# Patient Record
Sex: Female | Born: 1995 | Race: Black or African American | Hispanic: No | Marital: Single | Smoking: Never smoker
Health system: Southern US, Community
[De-identification: ages and names within clinical notes are randomized; demographics above are authoritative.]

## PROBLEM LIST (undated history)

## (undated) ENCOUNTER — Inpatient Hospital Stay: Payer: Self-pay

## (undated) DIAGNOSIS — T7840XA Allergy, unspecified, initial encounter: Secondary | ICD-10-CM

## (undated) DIAGNOSIS — O364XX0 Maternal care for intrauterine death, not applicable or unspecified: Secondary | ICD-10-CM

## (undated) DIAGNOSIS — Z8759 Personal history of other complications of pregnancy, childbirth and the puerperium: Secondary | ICD-10-CM

## (undated) HISTORY — DX: Allergy, unspecified, initial encounter: T78.40XA

## (undated) HISTORY — DX: Maternal care for intrauterine death, not applicable or unspecified: O36.4XX0

## (undated) HISTORY — DX: Personal history of other complications of pregnancy, childbirth and the puerperium: Z87.59

## (undated) HISTORY — PX: WISDOM TOOTH EXTRACTION: SHX21

---
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
## 2014-06-17 ENCOUNTER — Emergency Department: Payer: Self-pay | Admitting: Emergency Medicine

## 2014-06-19 ENCOUNTER — Emergency Department: Payer: Self-pay | Admitting: Emergency Medicine

## 2014-06-26 LAB — TSH: TSH: 0.32 u[IU]/mL — AB (ref <=5.90)

## 2014-06-26 LAB — LIPID PANEL
Cholesterol: 146 mg/dL (ref 0–200)
HDL: 44 mg/dL (ref 35–70)
LDL Cholesterol: 87 mg/dL
Triglycerides: 75 mg/dL (ref 40–160)

## 2014-06-26 LAB — BASIC METABOLIC PANEL: Glucose: 74 mg/dL

## 2014-06-29 ENCOUNTER — Encounter: Payer: Self-pay | Admitting: *Deleted

## 2014-07-03 ENCOUNTER — Encounter: Payer: Self-pay | Admitting: *Deleted

## 2014-07-05 ENCOUNTER — Encounter: Payer: Self-pay | Admitting: Endocrinology

## 2014-07-05 ENCOUNTER — Encounter: Payer: Medicaid Other | Admitting: Endocrinology

## 2014-07-05 DIAGNOSIS — R7989 Other specified abnormal findings of blood chemistry: Secondary | ICD-10-CM | POA: Insufficient documentation

## 2014-07-05 DIAGNOSIS — N6452 Nipple discharge: Secondary | ICD-10-CM | POA: Insufficient documentation

## 2014-07-05 NOTE — Progress Notes (Signed)
Pre visit review using our clinic review tool, if applicable. No additional management support is needed unless otherwise documented below in the visit note. 

## 2014-07-05 NOTE — Progress Notes (Signed)
Reason for visit: Low TSH/hyperthyroidism HPI  Holly Holland is a 18 y.o.-year-old female, referred by her PCP,  Tommy RainwaterSOWLES,KRICKNA F, MD, for evaluation for hyperthyroidism/low TSH.   The patient was checked in for this visit per usual. We then realized that our office doesn't accept [facility] and the patient was wrongly scheduled. This was explained to the patient.  She and I elected not to continue this visit. I apologized to the patient for the error created.  Our office called the referral office to divert the referral to an endocrine office that does accept her insurance.  The patient will not be billed for this encounter.   Quillian QuinceHADKE,Eddie Koc [facility] 07/05/2014  2:48 PM This encounter was created in error - please disregard.

## 2014-09-07 NOTE — L&D Delivery Note (Addendum)
DELIVERY NOTE:  Date of Delivery: 07/01/2015 Primary OB: [facility]  Gestational Age/EDD: 2641w0d 06/24/2015, by Last Menstrual Period Antepartum complications: none Attending Physician: Annamarie MajorPaul Darren Caldron, MD, FACOG Delivery Type: primary cesarean section, low transverse incision  Anesthesia: epidural Laceration: n/a Episiotomy: none Placenta: manual removal Intrapartum complications: Failure to Progress Estimated Blood Loss: 500 GBS: pos Procedure Details: CPD/Arrest of descent at 9 cm.  CS.    Baby: Liveborn female, Apgars 9/9, weight 7 #, 4 oz,

## 2014-10-16 ENCOUNTER — Emergency Department: Payer: Self-pay | Admitting: Emergency Medicine

## 2014-12-28 NOTE — Consult Note (Signed)
Referral Information:  Reason for Referral Fetal hydrops   Referring Physician WS   Prenatal Hx Holly Holland is an 19 year-old G1 P0 at 20 4/7 weeks ([place] 10/15/13) who presents with her mother, the father-of-the-baby and his mother for consultation regarding ultrasound findings from today that demonstrated fetal hydrops.  She had an ultrasound at 9 6/7 weeks at [facility] demonstrating a possible cystic hygroma. She returned at 13 3/7 weeks and Harmony testing sent (without sex chromosomes) that demonstrated low risk (less than 1 in 10,000) for each Trisomy 21, 13, or 18.    She has no medical problems. She denies any viral/flu-like illnesses this pregnancy. She also denies vaginal bleeding.   Past Obstetrical Hx G1 P0   Home Medications: Medication Instructions Status  multivitamin, prenatal 1  orally once a day Active   Vital Signs/Notes:  Nursing Vital Signs: **Vital Signs.:   11-Sep-14 08:48  Pulse Pulse 161  Systolic BP Systolic BP 096  Diastolic BP (mmHg) Diastolic BP (mmHg) 83   Perinatal Consult:  PGyn Hx Denies history of abnormal paps or STDs   Past Medical History cont'd Denies history of HTN, DM or endocrine disorders   PSurg Hx Wisdom teeth extraction, no complications   FHx Denies FH of birth defects, mental retardation or genetic disorders   Soc Hx Single. Denies use of ETOH, tobacco or drugs. Father of the baby is involved.   Review Of Systems:  Subjective No complaints. No rashes. No fever. No abdominal pain. No vagainal bleeding   Fever/Chills No   Cough No   Abdominal Pain No   Nausea/Vomiting No   SOB/DOE No   Chest Pain No   Tolerating Diet Yes   Medications/Allergies Reviewed Reports no known drug allergies    Additional Lab/Radiology Notes [place] ([place], 05/18/13): See full [place] report. IUP at 18 4/7 weeks. Hydrops (cystic hygroma, anasarca, large bilateral pleural effusions). Heart suboptimally visualized but concern for  hypoplastic ventricle.  The heart rate was normal and regular.  Right kidney also not visualized.  Amniocentesis performed. Fluid sent for AFP, karyotype, and toxo/CMV pcr.  Prenatal labs (02/10/13, [facility]): Blood type O positive, antibody screen negative, RPR non-reactive, HIV non-reactive, Hep B negative, Hct 33.5, MCV 94, Plt 268, GC/Chl negative, Varicella immune, Rubella immune  Harmony (04/12/13, [facility]): Low risk for trisomy 21, 13, or 18 (less than 1 in 10,000).  Sex chromosomes not performed. We called to add on but sample no longer available per [facility].   Impression/Recommendations:  Impression 19 year-old G1 P0 at 34 4/7 weeks with pregnancy complicated by fetal hydrops (large cystic hygroma, bilateral pleural effusions and anasara.  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   Recommendations Ms Amara and her family met with me and with Ms. Wells (Genetic Counseling) concerning the findings of non-immune hydrops and other fetal findings as listed above.  We disscussed the large differential diagnosis for non-immune hydrops including but not limited to aneuploidy, syndromes, congential heart disease and other congenital anomolies and congital infections. We also discussed that the outcomes are dependent on the etiology for hydrops but in general there is a very poor prognosis for hydrops.  Her Harmony testing was normal but only looked at Trisomy 18, 13, and 21. She elected to proceed with amniocentesis, which was sent for karyotype, AFP and Toxo/CMV pcr.  Visualization of the heart anatomy was very limited by fetal position and  the presence of anasarca and bilateral pleural effusions. We could not obtain a normal-appearing four chamber view and have concerns for possible hypoplastic ventricle.  Furthermore, we were unable to obtain adequate views of the outflow tracts or  short-axis f We have arranged for a fetal echo to be done at [facility] tomorrow.  She is aware of the poor prognosis for this fetus. She declines pregnancy termination and desires to proceed with a workup. We also discussed risk for early, severe preeclampsia (mirror syndrome) and risk for fetal demise.  Plan: -Amnio sent for AFP, karyotype and toxo/cmv pcr -Fetal echo scheduled for tomorrow at [facility] pregnancy termination -Return in one week to review results and for f/u ultrasound -Follow closely for signs of preeclampsia.  -May need transfer of care for delivery at a tertiary care facility depending on workup. -Please call with any questions.  -See genetic counseling report -[place] scheduled in one week for viability, kidneys, hydrops    Total Time Spent with Patient 60 minutes   >50% of visit spent in couseling/coordination of care yes   Office Use Only 99244  Level 4 (20mn) NEW office consult low complexity   Coding Description: FETAL - 2nd/3rd TRIMESTER INDICATION(S).   Hydrops.  Electronic Signatures: Conard Alvira, CMali(MD)  (Signed 11-Sep-14 15:14)  Authored: Referral, Home Medications, Vital Signs/Notes, Consult, Exam, Lab/Radiology Notes, Impression, Billing, Coding Description   Last Updated: 11-Sep-14 15:14 by Sharlotte Baka, CMali(MD)

## 2014-12-28 NOTE — Consult Note (Signed)
Referral Information:  Reason for Referral Followup MFM consult   Referring Physician [facility]   Prenatal Hx Holly Holland returns today for viability ultrasound and to review the results of her fetal echo.  See full MFM consult dated 05/18/13.  In short, Holly Holland is an 19 year-old G1 P0 at 3519 4/7 weeks with pregnancy complicated by hydrops.  There is a large septated cystic hygroma, large bilateral pleural effusions and anasarca.   Our detailed ultrasound last week showed concern for a hypoplastic ventricle and we could only image one AV valve.  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  In addition, we have not been able to visualize a right kidney.  We performed an amniocentesis last week.  Toxo and CMV infection studies (pcr) were negative.  The amniotic fluid AFP level was normal. The karyotype is pending.  Holly Holland understands the very severe situation.   Allergies:   Pollen: Other   Additional Lab/Radiology Notes US [place]([facility], 05/25/13): Followup viability scan was performed today.  There is continued hydrops present (large septated cystic hygroma, large bilateral pleural effusions, anasarca).  The bladder is not visualized today. The right kidney is again not visualized. The amniotic fluid volume is normal and there is no placenta thickening. The left ventricle is hypoplastic. See full US report from today and 05/18/13.   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   Recommendations Holly Holland declines pregnancy termination. She and her family understand the extreme poor prognosis for this baby. She also understands the risk for stillbirth, maternal  mirror syndrome, and neonatal death.  We have arranged to [facility]. Her appointment is scheduled for October 2 at 1:00 pm.  We will schedule a followup ultrasound appointment in the [facility] to be done in about 1 month during that transfer visit.    We discussed signs and symptoms of mirror syndrome. She will call with any concerns.  I have contacted our perinatal nurse coordinator to alert her of this case as well. Finally, Holly Holland has a followup fetal echo scheduled in October.  [facility] and will forward the amniocentesis results when they are available.    Total Time Spent with Patient 15 minutes   >50% of visit spent in couseling/coordination of care yes   Office Use Only [id number]  Office Visit Level 3 (15min) EST exp prob focused outpt   Coding Description: OTHER: Fetal hydrops, Fetal congenital heart disease.  Electronic Signatures: Holly Holland, [place] (MD)  (Signed 18-Sep-14 13:29)  Authored: Referral, Allergies, Lab/Radiology Notes, Impression, Billing, Coding Description   Last Updated: 18-Sep-14 13:29 by Naod Sweetland, [place] (MD)

## 2015-03-10 ENCOUNTER — Observation Stay
Admission: EM | Admit: 2015-03-10 | Discharge: 2015-03-10 | Disposition: A | Discharge status: Home / Self Care | Payer: Medicaid Other | Attending: Obstetrics and Gynecology | Admitting: Obstetrics and Gynecology

## 2015-03-10 DIAGNOSIS — O26892 Other specified pregnancy related conditions, second trimester: Secondary | ICD-10-CM | POA: Diagnosis not present

## 2015-03-10 DIAGNOSIS — Z3A24 24 weeks gestation of pregnancy: Secondary | ICD-10-CM | POA: Diagnosis not present

## 2015-03-10 DIAGNOSIS — R109 Unspecified abdominal pain: Secondary | ICD-10-CM | POA: Insufficient documentation

## 2015-03-10 DIAGNOSIS — M549 Dorsalgia, unspecified: Secondary | ICD-10-CM | POA: Insufficient documentation

## 2015-03-10 DIAGNOSIS — Z3490 Encounter for supervision of normal pregnancy, unspecified, unspecified trimester: Secondary | ICD-10-CM

## 2015-03-10 LAB — URINALYSIS COMPLETE WITH MICROSCOPIC (ARMC ONLY)
Bilirubin Urine: NEGATIVE
Glucose, UA: NEGATIVE mg/dL
Hgb urine dipstick: NEGATIVE
Ketones, ur: NEGATIVE mg/dL
Nitrite: NEGATIVE
Protein, ur: NEGATIVE mg/dL
SPECIFIC GRAVITY, URINE: 1.005 (ref 1.005–1.030)
pH: 7 (ref 5.0–8.0)

## 2015-03-10 NOTE — Progress Notes (Signed)
Pt given d/c inst and verbalized understanding.  Pt was then d/c home with mother and SO in stable condition.

## 2015-03-10 NOTE — OB Triage Provider Note (Signed)
Triage Note  Holly Holland is a 19 y.o. G2P0010 with Estimated Date of Delivery: 06/24/15 per LMP and 8 wk US who presents at 3298w6d  presenting for abdominal cramping and back pain. Reports symptoms last night with resolution and return this evening. Patient states she denies vaginal bleeding, intact membranes, with active fetal movement.  Pain is worse with fetal movement  Prenatal Course Source of Care: [facility] with onset of care at 8 weeks Pregnancy complications or risks: Patient Active Problem List   Diagnosis Date Noted  . Pregnancy 03/10/2015      . Nipple discharge in female 07/05/2014     Prenatal labs and studies: Genetic screening: Panorama negative    Prenatal Transfer Tool   Past Medical History  Diagnosis Date  . Allergy     Past Surgical History  Procedure Laterality Date  . Wisdom tooth extraction      OB History  Gravida Para Term Preterm AB SAB TAB Ectopic Multiple Living  2 0 0 0 1 1 0 0 0 0     # Outcome Date GA Lbr Len/2nd Weight Sex Delivery Anes PTL Lv  2 Current           1 SAB             Obstetric Comments  IUFD for Turner's Syndrome    History   Social History  . Marital Status: Single    Spouse Name: N/A  . Number of Children: N/A  . Years of Education: N/A   Social History Main Topics  . Smoking status: Never Smoker   . Smokeless tobacco: Not on file  . Alcohol Use: No  . Drug Use: No  . Sexual Activity: Yes   Other Topics Concern  . None   Social History Narrative    Family History  Problem Relation Age of Onset  . Asthma Maternal Aunt   . Hypertension Paternal Grandmother     Prescriptions prior to admission  Medication Sig Dispense Refill Last Dose  . Prenatal Vit-Fe Fumarate-FA (PRENATAL MULTIVITAMIN) TABS tablet Take 1 tablet by mouth daily at 12 noon.       No Known Allergies  Review of Systems: Negative except for what is mentioned in HPI.  Physical Exam: BP 119/68 mmHg  Pulse 72  Temp(Src) 98.2 F  (36.8 C) (Oral)  Resp 18  Ht 5\' 4"  (1.626 m)  Wt 170 lb (77.111 kg)  BMI 29.17 kg/m2  LMP 09/17/2014 GENERAL: Well-developed, well-nourished female in no acute distress.  ABDOMEN: Soft, nontender, nondistended, gravid. EXTREMITIES: Nontender, no edema Cervical Exam: Dilatation 0 cm   Effacement 0 %   Station high   FHT: category 1 for gestational age Contractions: none per toco  UA pending   Pertinent Labs/Studies:   No results found for this or any previous visit (from the past 24 hour(s)).  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

## 2015-03-10 NOTE — OB Triage Note (Signed)
Cramping started around 10pm yesterday till 1am.  It went away and came back around 1900 tonight,   Denies ROM or VB.   Abd feels like it is "tightening". Concerned d/t previous loss.

## 2015-03-13 LAB — URINE CULTURE: SPECIAL REQUESTS: NORMAL

## 2015-05-08 ENCOUNTER — Observation Stay
Admission: EM | Admit: 2015-05-08 | Discharge: 2015-05-08 | Disposition: A | Discharge status: Home / Self Care | Payer: Medicaid Other | Attending: Obstetrics and Gynecology | Admitting: Obstetrics and Gynecology

## 2015-05-08 DIAGNOSIS — Z3A33 33 weeks gestation of pregnancy: Secondary | ICD-10-CM | POA: Diagnosis not present

## 2015-05-08 DIAGNOSIS — O26893 Other specified pregnancy related conditions, third trimester: Principal | ICD-10-CM | POA: Insufficient documentation

## 2015-05-08 DIAGNOSIS — O4292 Full-term premature rupture of membranes, unspecified as to length of time between rupture and onset of labor: Secondary | ICD-10-CM | POA: Diagnosis present

## 2015-05-08 DIAGNOSIS — N898 Other specified noninflammatory disorders of vagina: Secondary | ICD-10-CM | POA: Insufficient documentation

## 2015-05-08 NOTE — H&P (Signed)
Obstetric H&P   Chief Complaint: Leaking fluid  Prenatal Care Provider: [facility]  History of Present Illness: 19 y.o. G2P0010 [redacted]w[redacted]d by 06/24/2015, presenting to L&D with complaints of leaking fluid.  Intermitten contractions, no LOF, no VB.  The patients past medical history if notable for prior pregnancy complicated by IUFD, confirmed turners, with negative panorama and 1st trimester screening this pregnancy.  ABO, Rh: O pos  Antibody: Negative Rubella: Immune Varicella: Immune RPR: NR HBsAg: negative HIV: negative   Review of Systems: 10 point review of systems negative unless otherwise noted in HPI  Past Medical History: Past Medical History  Diagnosis Date  . Allergy     Past Surgical History: Past Surgical History  Procedure Laterality Date  . Wisdom tooth extraction     Family History: Family History  Problem Relation Age of Onset  . Asthma Maternal Aunt   . Hypertension Paternal Grandmother     Social History: Social History   Social History  . Marital Status: Single    Spouse Name: N/A  . Number of Children: N/A  . Years of Education: N/A   Occupational History  . Not on file.   Social History Main Topics  . Smoking status: Never Smoker   . Smokeless tobacco: Not on file  . Alcohol Use: No  . Drug Use: No  . Sexual Activity: Yes   Other Topics Concern  . Not on file   Social History Narrative    Medications: Prior to Admission medications   Medication Sig Start Date End Date Taking? Authorizing Provider  fluticasone (FLONASE) 50 MCG/ACT nasal spray Place 2 sprays into both nostrils as needed.     Historical Provider, MD  levonorgestrel (MIRENA) 20 MCG/24HR IUD 1 each by Intrauterine route once.    Historical Provider, MD  loratadine (CLARITIN) 10 MG tablet Take 10 mg by mouth daily as needed.     Historical Provider, MD  montelukast (SINGULAIR) 10 MG tablet Take 10 mg by mouth at bedtime as needed.     Historical Provider, MD  Prenatal Vit-Fe  Fumarate-FA (PRENATAL MULTIVITAMIN) TABS tablet Take 1 tablet by mouth daily at 12 noon.    Historical Provider, MD    Allergies: No Known Allergies  Physical Exam: Vitals: Last menstrual period 09/17/2014.  Urine Dip Protein: N/A  FHT: 140, moderate, +accels, no decels Toco: q7-71min  General: NAD HEENT: normocephalic, anicteric Pulmonary: no increased work of breathing Cardiovascular: RRR Abdomen: Gravid, non-tender Genitourinary: closed / thick / high Extremities: no edema  Labs: No results found for this or any previous visit (from the past 24 hour(s)).  Assessment: 19 y.o. G2P0010 [redacted]w[redacted]d by 06/24/2015, by Last Menstrual Period presenting with physiologic discharge  Plan: 1) Physiologic discharge - no evidence of ROM on exam, negative nitrazine, cervix closed  2) Fetus - cat I tracing  3) Disposition - discharge home follow up on 05/15/15

## 2015-05-08 NOTE — Discharge Instructions (Signed)
Braxton Hicks Contractions °Contractions of the uterus can occur throughout pregnancy. Contractions are not always a sign that you are in labor.  °WHAT ARE BRAXTON HICKS CONTRACTIONS?  °Contractions that occur before labor are called Braxton Hicks contractions, or false labor. Toward the end of pregnancy (32-34 weeks), these contractions can develop more often and may become more forceful. This is not true labor because these contractions do not result in opening (dilatation) and thinning of the cervix. They are sometimes difficult to tell apart from true labor because these contractions can be forceful and people have different pain tolerances. You should not feel embarrassed if you go to the hospital with false labor. Sometimes, the only way to tell if you are in true labor is for your health care provider to look for changes in the cervix. °If there are no prenatal problems or other health problems associated with the pregnancy, it is completely safe to be sent home with false labor and await the onset of true labor. °HOW CAN YOU TELL THE DIFFERENCE BETWEEN TRUE AND FALSE LABOR? °False Labor °· The contractions of false labor are usually shorter and not as hard as those of true labor.   °· The contractions are usually irregular.   °· The contractions are often felt in the front of the lower abdomen and in the groin.   °· The contractions may go away when you walk around or change positions while lying down.   °· The contractions get weaker and are shorter lasting as time goes on.   °· The contractions do not usually become progressively stronger, regular, and closer together as with true labor.   °True Labor °· Contractions in true labor last 30-70 seconds, become very regular, usually become more intense, and increase in frequency.   °· The contractions do not go away with walking.   °· The discomfort is usually felt in the top of the uterus and spreads to the lower abdomen and low back.   °· True labor can be  determined by your health care provider with an exam. This will show that the cervix is dilating and getting thinner.   °WHAT TO REMEMBER °· Keep up with your usual exercises and follow other instructions given by your health care provider.   °· Take medicines as directed by your health care provider.   °· Keep your regular prenatal appointments.   °· Eat and drink lightly if you think you are going into labor.   °· If Braxton Hicks contractions are making you uncomfortable:   °¨ Change your position from lying down or resting to walking, or from walking to resting.   °¨ Sit and rest in a tub of warm water.   °¨ Drink 2-3 glasses of water. Dehydration may cause these contractions.   °¨ Do slow and deep breathing several times an hour.   °WHEN SHOULD I SEEK IMMEDIATE MEDICAL CARE? °Seek immediate medical care if: °· Your contractions become stronger, more regular, and closer together.   °· You have fluid leaking or gushing from your vagina.   °· You have a fever.   °· You pass blood-tinged mucus.   °· You have vaginal bleeding.   °· You have continuous abdominal pain.   °· You have low back pain that you never had before.   °· You feel your baby's head pushing down and causing pelvic pressure.   °· Your baby is not moving as much as it used to.   °Document Released: 08/24/2005 Document Revised: 08/29/2013 Document Reviewed: 06/05/2013 °Exit[employer]® Patient Information ©2015 [employer]. This information is not intended to replace advice given to you by your health care   provider. Make sure you discuss any questions you have with your health care provider. °LABOR: When contractions begin, you should start to time them from the beginning of one contraction to the beginning of the next.  When contractions are 5-10 minutes apart or less and have been regular for at least an hour, you should call your health care provider. ° °Notify your doctor if any of the following occur: °1. Bleeding from the vagina 7. Sudden, constant,  or occasional abdominal pain  °2. Pain or burning when urinating 8. Sudden gushing of fluid from the vagina (with or without continued leaking)  °3. Chills or fever 9. Fainting spells, "black outs" or loss of consciousness  °4. Increase in vaginal discharge 10. Severe or continued nausea or vomiting  °5. Pelvic pressure (sudden increase) 11. Blurring of vision or spots before the eyes  °6. Baby moving less than usual 12. Leaking of fluid  ° ° °FETAL KICK COUNT: °Lie on your left side for one hour after a meal, and count the number of times your baby kicks. If it is less than 5 times, get up, move around and drink some juice. Repeat the test 30 minutes later. If it is still less than 5 kicks in an hour, notify your doctor. °

## 2015-05-22 ENCOUNTER — Observation Stay
Admission: EM | Admit: 2015-05-22 | Discharge: 2015-05-22 | Disposition: A | Discharge status: Home / Self Care | Payer: Medicaid Other | Attending: Certified Nurse Midwife | Admitting: Certified Nurse Midwife

## 2015-05-22 DIAGNOSIS — O26899 Other specified pregnancy related conditions, unspecified trimester: Secondary | ICD-10-CM

## 2015-05-22 DIAGNOSIS — R1031 Right lower quadrant pain: Secondary | ICD-10-CM | POA: Diagnosis present

## 2015-05-22 DIAGNOSIS — Z3A35 35 weeks gestation of pregnancy: Secondary | ICD-10-CM | POA: Diagnosis not present

## 2015-05-22 DIAGNOSIS — N760 Acute vaginitis: Secondary | ICD-10-CM | POA: Insufficient documentation

## 2015-05-22 DIAGNOSIS — R109 Unspecified abdominal pain: Secondary | ICD-10-CM | POA: Diagnosis not present

## 2015-05-22 DIAGNOSIS — O23593 Infection of other part of genital tract in pregnancy, third trimester: Secondary | ICD-10-CM | POA: Diagnosis not present

## 2015-05-22 LAB — URINALYSIS COMPLETE WITH MICROSCOPIC (ARMC ONLY)
BILIRUBIN URINE: NEGATIVE
Bacteria, UA: NONE SEEN
GLUCOSE, UA: NEGATIVE mg/dL
Hgb urine dipstick: NEGATIVE
KETONES UR: NEGATIVE mg/dL
Nitrite: NEGATIVE
PH: 7 (ref 5.0–8.0)
Protein, ur: NEGATIVE mg/dL
Specific Gravity, Urine: 1.013 (ref 1.005–1.030)

## 2015-05-22 MED ORDER — FLUCONAZOLE 50 MG PO TABS
150.0000 mg | ORAL_TABLET | Freq: Once | ORAL | Status: AC
  Administered 2015-05-22: 150 mg via ORAL
  Filled 2015-05-22: qty 3

## 2015-05-22 MED ORDER — ACETAMINOPHEN 500 MG PO TABS
1000.0000 mg | ORAL_TABLET | Freq: Four times a day (QID) | ORAL | Status: DC | PRN
  Administered 2015-05-22: 1000 mg via ORAL
  Filled 2015-05-22: qty 2

## 2015-05-22 MED ORDER — TERBUTALINE SULFATE 1 MG/ML IJ SOLN
0.2500 mg | Freq: Once | INTRAMUSCULAR | Status: AC
  Administered 2015-05-22: 0.25 mg via SUBCUTANEOUS

## 2015-05-22 NOTE — Progress Notes (Signed)
Triage L&D Note  Chief Complaint: c/o cramping since 1 PM today and an intermittent pinching pain on the right lower quadrant.   Prenatal Care Provider: [facility]  History of Present Illness: 19 y.o. G2P0010 [redacted]w[redacted]d by 06/24/2015, presenting to L&D with complaints of cramping and a pinching pain in the RLQ that radiates into the vaginal area. Denies VB.Has had a white discharge, but has no vulvar itching or odor. No dysuria or hematuria. Had a loose stool this evening and intermittent nausea, but she has eaten a normal diet (pizza) and has been drinking water. Was up on her feet quite a bit yesterday shopping. No falls or trauma. Baby active. The patients past medical history if notable for prior pregnancy complicated by IUFD, confirmed turners, with negative panorama and 1st trimester screening this pregnancy.  ABO, Rh: O pos  Antibody: Negative Rubella: Immune Varicella: Immune RPR: NR HBsAg: negative HIV: negative   Review of Systems: see HPI  Past Medical History: Past Medical History  Diagnosis Date  . Allergy     Past Surgical History: Past Surgical History  Procedure Laterality Date  . Wisdom tooth extraction     Family History: Family History  Problem Relation Age of Onset  . Asthma Maternal Aunt   . Hypertension Paternal Grandmother     Social History: Social History   Social History  . Marital Status: Single    Spouse Name: N/A  . Number of Children: N/A  . Years of Education: N/A   Occupational History  . Not on file.   Social History Main Topics  . Smoking status: Never Smoker   . Smokeless tobacco: Not on file  . Alcohol Use: No  . Drug Use: No  . Sexual Activity: Yes   Other Topics Concern  . Not on file   Social History Narrative    Medications: PNV Allergies: No Known Allergies  Physical Exam: Vitals: BP 112/61 mmHg  Pulse 85  LMP 09/17/2014 General : initially  appeared uncomfortable, intermittently holding right side and breathing thru contractions. Now appears comfortable Lungs: CTA Heart: RRR without murmur Abdomen: tender over right lower border of uterus, otherwise uterus and upper abdomen soft and NT/ cephalic presentation FHT: 140s with accelerations to 170s to 180, moderate, , no decels Toco: q4-7 min contractions, palpated mild on arrival. Contractions petered out after one dose of terbutaline, and are now occasional. Cervix: closed internal os/ long/ OOP.  Wet prep of white discharge: positive hyphae Results for orders placed or performed during the hospital encounter of 05/22/15 (from the past 24 hour(s))  Urinalysis complete, with microscopic ([facility] only)     Status: Abnormal   Collection Time: 05/22/15  9:00 PM  Result Value Ref Range   Color, Urine YELLOW (A) YELLOW   APPearance HAZY (A) CLEAR   Glucose, UA NEGATIVE NEGATIVE mg/dL   Bilirubin Urine NEGATIVE NEGATIVE   Ketones, ur NEGATIVE NEGATIVE mg/dL   Specific Gravity, Urine 1.013 1.005 - 1.030   Hgb urine dipstick NEGATIVE NEGATIVE   pH 7.0 5.0 - 8.0   Protein, ur NEGATIVE NEGATIVE mg/dL   Nitrite NEGATIVE NEGATIVE   Leukocytes, UA 3+ (A) NEGATIVE   RBC / HPF 0-5 0 - 5 RBC/hpf   WBC, UA 0-5 0 - 5 WBC/hpf   Bacteria, UA NONE SEEN NONE SEEN   Squamous Epithelial / LPF 6-30 (A) NONE SEEN   Mucous PRESENT            Assessment: 19 y.o. G2P0010 at [redacted]w[redacted]d presented  with preterm contractions-resolved after one dose of terbutaline. Monilial vaginitis. Right round ligament pain. Cat 1 fetal heart tracing   Plan: 1) Diflucan 150 mgm x1 dose-given prior to discharge  2) Discharge home with labor precautions  3) Tylenol for round ligament pain. Also recommended heat/ soaking in tub/ rest  4.) FU at office as scheduled.  Farrel Conners, CNM

## 2015-05-26 LAB — URINE CULTURE: Special Requests: NORMAL

## 2015-06-30 ENCOUNTER — Inpatient Hospital Stay
Admission: EM | Admit: 2015-06-30 | Discharge: 2015-07-04 | DRG: 765 | Disposition: A | Discharge status: Home / Self Care | Payer: Medicaid Other | Attending: Obstetrics & Gynecology | Admitting: Obstetrics & Gynecology

## 2015-06-30 DIAGNOSIS — Z98891 History of uterine scar from previous surgery: Secondary | ICD-10-CM

## 2015-06-30 DIAGNOSIS — D62 Acute posthemorrhagic anemia: Secondary | ICD-10-CM | POA: Diagnosis not present

## 2015-06-30 DIAGNOSIS — Z3A41 41 weeks gestation of pregnancy: Secondary | ICD-10-CM | POA: Diagnosis not present

## 2015-06-30 DIAGNOSIS — O480 Post-term pregnancy: Secondary | ICD-10-CM | POA: Diagnosis present

## 2015-06-30 DIAGNOSIS — O99824 Streptococcus B carrier state complicating childbirth: Secondary | ICD-10-CM | POA: Diagnosis present

## 2015-06-30 DIAGNOSIS — Z3490 Encounter for supervision of normal pregnancy, unspecified, unspecified trimester: Secondary | ICD-10-CM

## 2015-06-30 DIAGNOSIS — O339 Maternal care for disproportion, unspecified: Secondary | ICD-10-CM | POA: Diagnosis present

## 2015-06-30 LAB — CHLAMYDIA/NGC RT PCR (ARMC ONLY)
Chlamydia Tr: NOT DETECTED
N GONORRHOEAE: NOT DETECTED

## 2015-06-30 LAB — TYPE AND SCREEN
ABO/RH(D): O POS
Antibody Screen: NEGATIVE

## 2015-06-30 LAB — CBC
HEMATOCRIT: 30.8 % — AB (ref 35.0–47.0)
Hemoglobin: 10.2 g/dL — ABNORMAL LOW (ref 12.0–16.0)
MCH: 30.9 pg (ref 26.0–34.0)
MCHC: 33 g/dL (ref 32.0–36.0)
MCV: 93.7 fL (ref 80.0–100.0)
PLATELETS: 161 10*3/uL (ref 150–440)
RBC: 3.29 MIL/uL — AB (ref 3.80–5.20)
RDW: 13.2 % (ref 11.5–14.5)
WBC: 9.1 10*3/uL (ref 3.6–11.0)

## 2015-06-30 LAB — ABO/RH: ABO/RH(D): O POS

## 2015-06-30 MED ORDER — LACTATED RINGERS IV SOLN
INTRAVENOUS | Status: DC
  Administered 2015-06-30: 125 mL/h via INTRAVENOUS
  Administered 2015-07-01: 13:00:00 via INTRAVENOUS
  Administered 2015-07-01: 125 mL/h via INTRAVENOUS

## 2015-06-30 MED ORDER — OXYTOCIN BOLUS FROM INFUSION: 500.0000 mL | INTRAVENOUS | Status: DC

## 2015-06-30 MED ORDER — OXYTOCIN 40 UNITS IN LACTATED RINGERS INFUSION - SIMPLE MED
62.5000 mL/h | INTRAVENOUS | Status: DC
  Administered 2015-07-01: 500 mL via INTRAVENOUS
  Filled 2015-06-30: qty 1000

## 2015-06-30 MED ORDER — SODIUM CHLORIDE 0.9 % IV SOLN: 2.0000 g | Freq: Once | INTRAVENOUS | Status: DC

## 2015-06-30 MED ORDER — DINOPROSTONE 10 MG VA INST
10.0000 mg | VAGINAL_INSERT | Freq: Once | VAGINAL | Status: AC
  Administered 2015-06-30: 10 mg via VAGINAL
  Filled 2015-06-30: qty 1

## 2015-06-30 MED ORDER — OXYCODONE-ACETAMINOPHEN 5-325 MG PO TABS: 1.0000 | ORAL_TABLET | ORAL | Status: DC | PRN

## 2015-06-30 MED ORDER — AMPICILLIN SODIUM 2 G IJ SOLR
2.0000 g | Freq: Once | INTRAMUSCULAR | Status: AC
Start: 2015-07-01 — End: 2015-07-01
  Administered 2015-07-01: 2 g via INTRAVENOUS
  Filled 2015-06-30: qty 2000

## 2015-06-30 MED ORDER — LACTATED RINGERS IV SOLN
500.0000 mL | INTRAVENOUS | Status: DC | PRN
  Administered 2015-07-01: 500 mL via INTRAVENOUS

## 2015-06-30 MED ORDER — CITRIC ACID-SODIUM CITRATE 334-500 MG/5ML PO SOLN
30.0000 mL | ORAL | Status: DC | PRN
  Administered 2015-07-01: 30 mL via ORAL
  Filled 2015-06-30: qty 15

## 2015-06-30 MED ORDER — ONDANSETRON HCL 4 MG/2ML IJ SOLN
4.0000 mg | Freq: Four times a day (QID) | INTRAMUSCULAR | Status: DC | PRN
  Administered 2015-07-01: 8 mg via INTRAVENOUS
  Administered 2015-07-01: 4 mg via INTRAVENOUS
  Filled 2015-06-30: qty 2

## 2015-06-30 MED ORDER — LIDOCAINE HCL (PF) 1 % IJ SOLN
30.0000 mL | INTRAMUSCULAR | Status: DC | PRN
  Filled 2015-06-30: qty 30

## 2015-06-30 MED ORDER — TERBUTALINE SULFATE 1 MG/ML IJ SOLN: 0.2500 mg | Freq: Once | INTRAMUSCULAR | Status: DC | PRN

## 2015-06-30 MED ORDER — OXYCODONE-ACETAMINOPHEN 5-325 MG PO TABS: 2.0000 | ORAL_TABLET | ORAL | Status: DC | PRN

## 2015-06-30 MED ORDER — ACETAMINOPHEN 325 MG PO TABS: 650.0000 mg | ORAL_TABLET | ORAL | Status: DC | PRN

## 2015-06-30 NOTE — Progress Notes (Signed)
Pt asking to get up.  EFM off.  Pt up to ambulate in hallway and eat a sandwich tray.

## 2015-07-01 ENCOUNTER — Encounter: Payer: Self-pay | Admitting: Anesthesiology

## 2015-07-01 ENCOUNTER — Encounter
Admission: EM | Disposition: A | Discharge status: Home / Self Care | Payer: Self-pay | Source: Home / Self Care | Attending: Obstetrics & Gynecology

## 2015-07-01 ENCOUNTER — Inpatient Hospital Stay: Payer: Medicaid Other | Admitting: Anesthesiology

## 2015-07-01 DIAGNOSIS — Z98891 History of uterine scar from previous surgery: Secondary | ICD-10-CM

## 2015-07-01 SURGERY — Surgical Case
Anesthesia: Epidural

## 2015-07-01 MED ORDER — ACETAMINOPHEN 325 MG PO TABS: 650.0000 mg | ORAL_TABLET | ORAL | Status: DC | PRN

## 2015-07-01 MED ORDER — CEFOXITIN SODIUM-DEXTROSE 2-2.2 GM-% IV SOLR (PREMIX): 2.0000 g | INTRAVENOUS | Status: DC

## 2015-07-01 MED ORDER — LACTATED RINGERS IV SOLN
INTRAVENOUS | Status: DC
  Administered 2015-07-01: 23:00:00 via INTRAVENOUS

## 2015-07-01 MED ORDER — BUPIVACAINE 0.25 % ON-Q PUMP DUAL CATH 400 ML: 400.0000 mL | INJECTION | Status: DC

## 2015-07-01 MED ORDER — ZOLPIDEM TARTRATE 5 MG PO TABS: 5.0000 mg | ORAL_TABLET | Freq: Every evening | ORAL | Status: DC | PRN

## 2015-07-01 MED ORDER — DIPHENHYDRAMINE HCL 25 MG PO CAPS
25.0000 mg | ORAL_CAPSULE | Freq: Four times a day (QID) | ORAL | Status: DC | PRN
  Administered 2015-07-02: 25 mg via ORAL
  Filled 2015-07-01: qty 1

## 2015-07-01 MED ORDER — OXYTOCIN 40 UNITS IN LACTATED RINGERS INFUSION - SIMPLE MED: 62.5000 mL/h | INTRAVENOUS | Status: DC

## 2015-07-01 MED ORDER — WITCH HAZEL-GLYCERIN EX PADS: 1.0000 "application " | MEDICATED_PAD | CUTANEOUS | Status: DC | PRN

## 2015-07-01 MED ORDER — MORPHINE SULFATE (PF) 0.5 MG/ML IJ SOLN
INTRAMUSCULAR | Status: DC | PRN
  Administered 2015-07-01: 2 mg via EPIDURAL

## 2015-07-01 MED ORDER — OXYTOCIN 40 UNITS IN LACTATED RINGERS INFUSION - SIMPLE MED
INTRAVENOUS | Status: AC
  Filled 2015-07-01: qty 1000

## 2015-07-01 MED ORDER — AMMONIA AROMATIC IN INHA
RESPIRATORY_TRACT | Status: AC
  Filled 2015-07-01: qty 10

## 2015-07-01 MED ORDER — PRENATAL MULTIVITAMIN CH
1.0000 | ORAL_TABLET | Freq: Every day | ORAL | Status: DC
  Administered 2015-07-02 – 2015-07-04 (×3): 1 via ORAL
  Filled 2015-07-01 (×3): qty 1

## 2015-07-01 MED ORDER — NALBUPHINE HCL 10 MG/ML IJ SOLN
5.0000 mg | INTRAMUSCULAR | Status: DC | PRN
  Filled 2015-07-01: qty 0.5

## 2015-07-01 MED ORDER — PHENYLEPHRINE HCL 10 MG/ML IJ SOLN
INTRAMUSCULAR | Status: DC | PRN
  Administered 2015-07-01 (×3): 100 ug via INTRAVENOUS

## 2015-07-01 MED ORDER — OXYTOCIN 10 UNIT/ML IJ SOLN
INTRAMUSCULAR | Status: AC
  Filled 2015-07-01: qty 2

## 2015-07-01 MED ORDER — BUPIVACAINE HCL (PF) 0.5 % IJ SOLN
INTRAMUSCULAR | Status: AC
  Filled 2015-07-01: qty 30

## 2015-07-01 MED ORDER — NALBUPHINE HCL 10 MG/ML IJ SOLN
5.0000 mg | Freq: Once | INTRAMUSCULAR | Status: DC | PRN
  Filled 2015-07-01: qty 0.5

## 2015-07-01 MED ORDER — LIDOCAINE HCL (PF) 2 % IJ SOLN
INTRAMUSCULAR | Status: DC | PRN
  Administered 2015-07-01: 19 mL via INTRADERMAL

## 2015-07-01 MED ORDER — LANOLIN HYDROUS EX OINT: 1.0000 "application " | TOPICAL_OINTMENT | CUTANEOUS | Status: DC | PRN

## 2015-07-01 MED ORDER — SIMETHICONE 80 MG PO CHEW: 80.0000 mg | CHEWABLE_TABLET | ORAL | Status: DC

## 2015-07-01 MED ORDER — TERBUTALINE SULFATE 1 MG/ML IJ SOLN: 0.2500 mg | Freq: Once | INTRAMUSCULAR | Status: DC | PRN

## 2015-07-01 MED ORDER — EPHEDRINE SULFATE 50 MG/ML IJ SOLN
INTRAMUSCULAR | Status: DC | PRN
  Administered 2015-07-01: 15 mg via INTRAVENOUS

## 2015-07-01 MED ORDER — INFLUENZA VAC SPLIT QUAD 0.5 ML IM SUSY
0.5000 mL | PREFILLED_SYRINGE | INTRAMUSCULAR | Status: AC
  Administered 2015-07-04: 0.5 mL via INTRAMUSCULAR
  Filled 2015-07-01: qty 0.5

## 2015-07-01 MED ORDER — LIDOCAINE-EPINEPHRINE (PF) 1.5 %-1:200000 IJ SOLN
INTRAMUSCULAR | Status: DC | PRN
  Administered 2015-07-01: 3 mL via PERINEURAL

## 2015-07-01 MED ORDER — FENTANYL 2.5 MCG/ML W/ROPIVACAINE 0.2% IN NS 100 ML EPIDURAL INFUSION (ARMC-ANES)
EPIDURAL | Status: DC | PRN
  Administered 2015-07-01: 10 mL/h via EPIDURAL

## 2015-07-01 MED ORDER — FENTANYL 2.5 MCG/ML W/ROPIVACAINE 0.2% IN NS 100 ML EPIDURAL INFUSION (ARMC-ANES)
EPIDURAL | Status: AC
  Filled 2015-07-01: qty 100

## 2015-07-01 MED ORDER — IBUPROFEN 600 MG PO TABS
600.0000 mg | ORAL_TABLET | Freq: Four times a day (QID) | ORAL | Status: DC | PRN
  Administered 2015-07-02 – 2015-07-04 (×4): 600 mg via ORAL
  Filled 2015-07-01 (×4): qty 1

## 2015-07-01 MED ORDER — OXYCODONE-ACETAMINOPHEN 5-325 MG PO TABS
1.0000 | ORAL_TABLET | ORAL | Status: DC | PRN
  Administered 2015-07-02 – 2015-07-04 (×6): 1 via ORAL
  Filled 2015-07-01 (×6): qty 1

## 2015-07-01 MED ORDER — DIPHENHYDRAMINE HCL 25 MG PO CAPS: 25.0000 mg | ORAL_CAPSULE | ORAL | Status: DC | PRN

## 2015-07-01 MED ORDER — LIDOCAINE HCL (PF) 1 % IJ SOLN
INTRAMUSCULAR | Status: AC
  Filled 2015-07-01: qty 30

## 2015-07-01 MED ORDER — FENTANYL CITRATE (PF) 100 MCG/2ML IJ SOLN
50.0000 ug | INTRAMUSCULAR | Status: AC | PRN
  Administered 2015-07-01 (×4): 50 ug via INTRAVENOUS

## 2015-07-01 MED ORDER — SENNOSIDES-DOCUSATE SODIUM 8.6-50 MG PO TABS: 2.0000 | ORAL_TABLET | ORAL | Status: DC

## 2015-07-01 MED ORDER — SIMETHICONE 80 MG PO CHEW
80.0000 mg | CHEWABLE_TABLET | Freq: Three times a day (TID) | ORAL | Status: DC
  Administered 2015-07-01: 80 mg via ORAL
  Filled 2015-07-01: qty 1

## 2015-07-01 MED ORDER — MISOPROSTOL 200 MCG PO TABS
ORAL_TABLET | ORAL | Status: AC
  Filled 2015-07-01: qty 4

## 2015-07-01 MED ORDER — MEPERIDINE HCL 25 MG/ML IJ SOLN: 6.2500 mg | INTRAMUSCULAR | Status: DC | PRN

## 2015-07-01 MED ORDER — NALOXONE HCL 0.4 MG/ML IJ SOLN: 0.4000 mg | INTRAMUSCULAR | Status: DC | PRN

## 2015-07-01 MED ORDER — BUPIVACAINE HCL (PF) 0.5 % IJ SOLN: 10.0000 mL | Freq: Once | INTRAMUSCULAR | Status: DC

## 2015-07-01 MED ORDER — BUPIVACAINE HCL (PF) 0.25 % IJ SOLN
INTRAMUSCULAR | Status: DC | PRN
  Administered 2015-07-01: 10 mL via EPIDURAL

## 2015-07-01 MED ORDER — SODIUM CHLORIDE 0.9 % IV SOLN
1.0000 g | INTRAVENOUS | Status: DC
  Administered 2015-07-01: 1 g via INTRAVENOUS

## 2015-07-01 MED ORDER — DIPHENHYDRAMINE HCL 50 MG/ML IJ SOLN: 12.5000 mg | INTRAMUSCULAR | Status: DC | PRN

## 2015-07-01 MED ORDER — ONDANSETRON HCL 4 MG/2ML IJ SOLN: 4.0000 mg | Freq: Three times a day (TID) | INTRAMUSCULAR | Status: DC | PRN

## 2015-07-01 MED ORDER — MONTELUKAST SODIUM 10 MG PO TABS: 10.0000 mg | ORAL_TABLET | Freq: Every evening | ORAL | Status: DC | PRN

## 2015-07-01 MED ORDER — CITRIC ACID-SODIUM CITRATE 334-500 MG/5ML PO SOLN: 30.0000 mL | Freq: Once | ORAL | Status: DC

## 2015-07-01 MED ORDER — FLUTICASONE PROPIONATE 50 MCG/ACT NA SUSP
2.0000 | NASAL | Status: DC | PRN
  Filled 2015-07-01: qty 16

## 2015-07-01 MED ORDER — LACTATED RINGERS IV SOLN: INTRAVENOUS | Status: DC

## 2015-07-01 MED ORDER — FENTANYL CITRATE (PF) 100 MCG/2ML IJ SOLN: 25.0000 ug | INTRAMUSCULAR | Status: DC | PRN

## 2015-07-01 MED ORDER — OXYTOCIN 40 UNITS IN LACTATED RINGERS INFUSION - SIMPLE MED
1.0000 m[IU]/min | INTRAVENOUS | Status: DC
  Administered 2015-07-01: 1 m[IU]/min via INTRAVENOUS

## 2015-07-01 MED ORDER — SCOPOLAMINE 1 MG/3DAYS TD PT72
1.0000 | MEDICATED_PATCH | Freq: Once | TRANSDERMAL | Status: DC
  Administered 2015-07-01: 1.5 mg via TRANSDERMAL
  Filled 2015-07-01: qty 1

## 2015-07-01 MED ORDER — DIBUCAINE 1 % RE OINT: 1.0000 "application " | TOPICAL_OINTMENT | RECTAL | Status: DC | PRN

## 2015-07-01 MED ORDER — MIDAZOLAM HCL 5 MG/5ML IJ SOLN
INTRAMUSCULAR | Status: DC | PRN
  Administered 2015-07-01: 2 mg via INTRAVENOUS

## 2015-07-01 MED ORDER — SIMETHICONE 80 MG PO CHEW
80.0000 mg | CHEWABLE_TABLET | ORAL | Status: DC | PRN
  Administered 2015-07-01 – 2015-07-03 (×4): 80 mg via ORAL
  Filled 2015-07-01 (×4): qty 1

## 2015-07-01 MED ORDER — BUPIVACAINE HCL 0.25 % IJ SOLN
INTRAMUSCULAR | Status: DC | PRN
  Administered 2015-07-01: 10 mL

## 2015-07-01 MED ORDER — MORPHINE SULFATE (PF) 2 MG/ML IV SOLN
1.0000 mg | INTRAVENOUS | Status: DC | PRN
  Administered 2015-07-01: 1 mg via INTRAVENOUS
  Filled 2015-07-01: qty 1

## 2015-07-01 MED ORDER — KETOROLAC TROMETHAMINE 30 MG/ML IJ SOLN
30.0000 mg | Freq: Four times a day (QID) | INTRAMUSCULAR | Status: AC
  Administered 2015-07-01 – 2015-07-02 (×4): 30 mg via INTRAVENOUS
  Filled 2015-07-01 (×4): qty 1

## 2015-07-01 MED ORDER — MENTHOL 3 MG MT LOZG: 1.0000 | LOZENGE | OROMUCOSAL | Status: DC | PRN

## 2015-07-01 MED ORDER — ONDANSETRON HCL 4 MG/2ML IJ SOLN: 4.0000 mg | Freq: Once | INTRAMUSCULAR | Status: DC | PRN

## 2015-07-01 MED ORDER — SODIUM CHLORIDE 0.9 % IJ SOLN: 3.0000 mL | INTRAMUSCULAR | Status: DC | PRN

## 2015-07-01 MED ORDER — NALOXONE HCL 2 MG/2ML IJ SOSY
1.0000 ug/kg/h | PREFILLED_SYRINGE | INTRAMUSCULAR | Status: DC | PRN
  Filled 2015-07-01: qty 2

## 2015-07-01 MED ORDER — FENTANYL CITRATE (PF) 100 MCG/2ML IJ SOLN
INTRAMUSCULAR | Status: AC
  Administered 2015-07-01: 50 ug via INTRAVENOUS
  Filled 2015-07-01: qty 2

## 2015-07-01 MED ORDER — SODIUM CHLORIDE 0.9 % IV SOLN
INTRAVENOUS | Status: AC
  Filled 2015-07-01: qty 1000

## 2015-07-01 SURGICAL SUPPLY — 22 items
CANISTER SUCT 3000ML (MISCELLANEOUS) ×2 IMPLANT
CATH KIT ON-Q SILVERSOAK 5IN (CATHETERS) ×4 IMPLANT
CHLORAPREP W/TINT 26ML (MISCELLANEOUS) ×4 IMPLANT
ELECT CAUTERY BLADE 6.4 (BLADE) ×2 IMPLANT
GLOVE SKINSENSE NS SZ8.0 LF (GLOVE) ×7
GLOVE SKINSENSE STRL SZ8.0 LF (GLOVE) ×7 IMPLANT
GOWN STRL REUS W/ TWL LRG LVL3 (GOWN DISPOSABLE) ×1 IMPLANT
GOWN STRL REUS W/ TWL XL LVL3 (GOWN DISPOSABLE) ×2 IMPLANT
GOWN STRL REUS W/TWL LRG LVL3 (GOWN DISPOSABLE) ×1
GOWN STRL REUS W/TWL XL LVL3 (GOWN DISPOSABLE) ×2
HEMOSTAT SURGICEL 2X3 (HEMOSTASIS) ×2 IMPLANT
LIQUID BAND (GAUZE/BANDAGES/DRESSINGS) ×4 IMPLANT
NS IRRIG 1000ML POUR BTL (IV SOLUTION) ×2 IMPLANT
PACK C SECTION AR (MISCELLANEOUS) ×2 IMPLANT
PAD GROUND ADULT SPLIT (MISCELLANEOUS) ×2 IMPLANT
PAD OB MATERNITY 4.3X12.25 (PERSONAL CARE ITEMS) ×2 IMPLANT
PAD PREP 24X41 OB/GYN DISP (PERSONAL CARE ITEMS) ×2 IMPLANT
STRIP CLOSURE SKIN 1/2X4 (GAUZE/BANDAGES/DRESSINGS) ×2 IMPLANT
SUT MAXON ABS #0 GS21 30IN (SUTURE) ×4 IMPLANT
SUT VIC AB 1 CT1 36 (SUTURE) ×10 IMPLANT
SUT VIC AB 2-0 CT1 36 (SUTURE) ×8 IMPLANT
SUT VIC AB 4-0 FS2 27 (SUTURE) ×4 IMPLANT

## 2015-07-01 NOTE — Transfer of Care (Signed)
Immediate Anesthesia Transfer of Care Note  Patient: Holly Holland  Procedure(s) Performed: Procedure(s): CESAREAN SECTION (N/A)  Patient Location: PACU and Mother/Baby  Anesthesia Type:Epidural  Level of Consciousness: awake, alert  and oriented  Airway & Oxygen Therapy: Patient Spontanous Breathing  Post-op Assessment: Report given to RN and Post -op Vital signs reviewed and stable  Post vital signs: Reviewed and stable  Last Vitals:  Filed Vitals:   07/01/15 1122  BP:   Pulse:   Temp: 36.8 C  Resp:     Complications: No apparent anesthesia complications

## 2015-07-01 NOTE — Progress Notes (Signed)
100 mcg Neos.

## 2015-07-01 NOTE — Anesthesia Procedure Notes (Signed)
Epidural Patient location during procedure: OB Start time: 07/01/2015 4:12 AM End time: 07/01/2015 4:21 AM  Staffing Anesthesiologist: Yves DillARROLL, Lanah Steines Performed by: anesthesiologist   Preanesthetic Checklist Completed: patient identified, site marked, surgical consent, pre-op evaluation, timeout performed, IV checked, risks and benefits discussed and monitors and equipment checked  Epidural Patient position: sitting Prep: Betadine and site prepped and draped Patient monitoring: heart rate, cardiac monitor, continuous pulse ox and blood pressure Approach: midline Location: L3-L4 Injection technique: LOR air  Needle:  Needle type: Tuohy  Needle gauge: 18 G Needle length: 9 cm Catheter type: closed end Catheter size: 20 Guage Test dose: negative and 1.5% lidocaine with Epi 1:200 K  Assessment Sensory level: T8  Additional Notes Time out called.  Patient placed in sitting position.  Prepped and draped in sterile fashion.  A skin wheal was made in the L3-L4 interspace with 1% Lidocaine plain.  A Tuohy needle was advanced to the epidural space by loss of resistance technique.  An epidural catheter was threaded easily with no paresthesias and negative TD.  Patient tolerated the procedure wellReason for block:procedure for pain

## 2015-07-01 NOTE — Progress Notes (Signed)
  Labor Progress Note   19 y.o. G2P1010 @ 845w6d , admitted for  Pregnancy, Labor Management.   Subjective:  Cervix unchanging, Pitocin started but then discontinued due to variable decelerations.  No change in cervix.  Objective:  BP 111/41 mmHg  Pulse 85  Temp(Src) 98.3 F (36.8 C) (Oral)  Resp 18  Ht 5\' 4"  (1.626 m)  Wt 192 lb (87.091 kg)  BMI 32.94 kg/m2  LMP 09/17/2014  Breastfeeding? Unknown Abd: gravid, NT  Extr: trace to 1+ bilateral pedal edema SVE: CERVIX: 8cm w edema, 80 % effaced, -2 station  EFM: FHR: 140 bpm, variability: moderate,  accelerations:  Present,  decelerations: var decels, worse when on Pitocin (up to 3 mU/min) Toco: Frequency: Every 2-4 minutes Labs: I have reviewed the patient's lab results.   Assessment & Plan:  G2P1010 @ 4545w6d, admitted for  Pregnancy and Labor/Delivery Management - - - Arrest of Descent 1. Pain management: epidural. 2. FWB: FHT category 1.  3. ID: GBS positive 4. Labor management: Pitocin augmentation not successful and not tolerated well.  Concern for CPD.  CS pros and cons discussed. The risks of cesarean section discussed with the patient included but were not limited to: bleeding which may require transfusion or reoperation; infection which may require antibiotics; injury to bowel, bladder, ureters or other surrounding organs; injury to the fetus; need for additional procedures including hysterectomy in the event of a life-threatening hemorrhage; placental abnormalities wth subsequent pregnancies, incisional problems, thromboembolic phenomenon and other postoperative/anesthesia complications. The patient concurred with the proposed plan, giving informed written consent for the procedure.    All discussed with patient, see orders

## 2015-07-01 NOTE — Progress Notes (Signed)
4cc 1 1/2% Lido with epi

## 2015-07-01 NOTE — Progress Notes (Signed)
  Labor Progress Note   19 y.o. G2P1010 @ 4968w6d , admitted for  Pregnancy, Labor Management.   Subjective:  Cervix 9-9.5, cts q 5 min; may need augmentation  Objective:  BP 111/41 mmHg  Pulse 85  Temp(Src) 98.8 F (37.1 C) (Oral)  Resp 18  Ht 5\' 4"  (1.626 m)  Wt 192 lb (87.091 kg)  BMI 32.94 kg/m2  LMP 09/17/2014  Breastfeeding? Unknown Abd: gravid, NT  Extr: trace to 1+ bilateral pedal edema SVE: CERVIX: 9-9.5 cm dilated, 90 effaced, -2 station  EFM: FHR: 140 bpm, variability: moderate,  accelerations:  Present,  decelerations:  Absent Toco: Frequency: Every 5 minutes Labs: I have reviewed the patient's lab results.   Assessment & Plan:  G2P1010 @ 2068w6d, admitted for  Pregnancy and Labor/Delivery Management  1. Pain management: epidural. 2. FWB: FHT category 1.  3. ID: GBS positive 4. Labor management: Pitocin augmentation.  All discussed with patient, see orders

## 2015-07-01 NOTE — H&P (Signed)
OB H&P Office H&P reviewed.  19 y/o G2P0 @ 41/0 here for PDIOL. Preg uncomplicated.  Leopolds 3800gm, cephalic Cervidil inserted by RN on admission @ 2130 FT dilation AF VS normal and stable Fetus category I and contracting q8619m and moderate distress GBS positive  Pt doing well. Okay for IV pain meds. Start amp when further along in labor  [place] Holly Holland, Holly HagemanJr MD [facility]  Pager: [phone]

## 2015-07-01 NOTE — Progress Notes (Signed)
Catheter placed

## 2015-07-01 NOTE — Progress Notes (Signed)
Cervidil removed as ordered by Dr. Vergie LivingPickens.

## 2015-07-01 NOTE — Op Note (Signed)
Cesarean Section Procedure Note Indications: cephalo-pelvic disproportion, failure to progress: arrest of descent and term intrauterine pregnancy  Pre-operative Diagnosis: Intrauterine pregnancy 6470w0d ;  cephalo-pelvic disproportion, failure to progress: arrest of descent and term intrauterine pregnancy Post-operative Diagnosis: same, delivered. Procedure: Low Transverse Cesarean Section Surgeon: Annamarie MajorPaul Harris, MD, FACOG Anesthesia: Epidural anesthesia Estimated Blood Loss:500 Complications: None; patient tolerated the procedure well. Disposition: PACU - hemodynamically stable. Condition: stable  Findings: A female infant in the cephalic presentation. Amniotic fluid - Clear  Birth weight 7-4 lbs.  Apgars of 9 and 9.  Intact placenta with a three-vessel cord. Grossly normal uterus, tubes and ovaries bilaterally. No intraabdominal adhesions were noted.  Procedure Details   The patient was taken to Operating Room, identified as the correct patient and the procedure verified as C-Section Delivery. A Time Out was held and the above information confirmed. After induction of anesthesia, the patient was draped and prepped in the usual sterile manner. A Pfannenstiel incision was made and carried down through the subcutaneous tissue to the fascia. Fascial incision was made and extended transversely with the Mayo scissors. The fascia was separated from the underlying rectus tissue superiorly and inferiorly. The peritoneum was identified and entered bluntly. Peritoneal incision was extended longitudinally. The utero-vesical peritoneal reflection was incised transversely and a bladder flap was created digitally.  A low transverse hysterotomy was made. The fetus was delivered atraumatically. The umbilical cord was clamped x2 and cut and the infant was handed to the awaiting pediatricians. The placenta was removed intact and appeared normal with a 3-vessel cord.  The uterus was exteriorized and cleared of all  clot and debris. The hysterotomy was closed with running sutures of 0 Vicryl suture. A second imbricating layer was placed with the same suture. Excellent hemostasis was observed. The uterus was returned to the abdomen. The pelvis was irrigated and again, excellent hemostasis was noted.  The On Q Pain pump System was then placed.  Trocars were placed through the abdominal wall into the subfascial space and these were used to thread the silver soaker cathaters into place.The rectus fascia was then reapproximated with running sutures of Maxon, with careful placement not to incorporate the cathaters. Subcutaneous tissues are then irrigated with saline and hemostasis assured.  Skin is then closed with 4-0 vicryl suture in a subcuticular fashion followed by skin adhesive. The cathaters are flushed each with 5 mL of Bupivicaine and stabilized into place with dressing. Instrument, sponge, and needle counts were correct prior to the abdominal closure and at the conclusion of the case.  The patient tolerated the procedure well and was transferred to the recovery room in stable condition.

## 2015-07-01 NOTE — Discharge Summary (Signed)
Obstetrical Discharge Summary  Date of Admission: 06/30/2015 Date of Discharge: 07/04/2015 Discharge Diagnosis: Term Pregnancy-delivered and Cephalopelvic dysproportion/ Anemia due to blood loss Primary OB:  [facility]   Gestational Age at Delivery: 6440w0d  Antepartum complications: Failure to progress Date of Delivery: 07/01/15   Delivered By: Tiburcio PeaHarris Delivery Type: primary cesarean section, low transverse incision Intrapartum complications/course: Failure to Progress Anesthesia: epidural Placenta: manual removal Laceration: n/a Episiotomy: none Live born female [place] Birth Weight: 7 lb 4.4 oz (3300 g) APGAR: 9, 9   Post partum course: Since the delivery, patient has tolerate activity, diet, and daily functions without difficulty or complication.  Min lochia.  Breast milk is in.  No signs of depression currently. Reports passing flatus. Incisional pain managed with ON Q and oral analgesics. Anemia postoperatively with H&H: 8.7gm/dl and 14%26%. Denies lightheadedness  Postpartum Exam:General appearance: alert and no distress Extremities: edema of feet and Homans sign is negative, no sign of DVT Breast: engorged, cracking at base of left nipple Abdomen: FF at U-1/ML/NT, BS active, abd soft  Heart: RRR without murmur Lungs:CTA  Disposition: home with infant Rh Immune globulin given: not applicable Rubella vaccine given: not applicable Varicella vaccine given: not applicable Tdap vaccine given in AP or PP setting: given during prenatal care Flu vaccine given in AP or PP setting: ordered postpartum Contraception: Nexplanon  Prenatal Labs: O POS//Rubella Immune//RPR negative//HIV negative/HepB Surface Ag negative//plans to breastfeed  Plan:  Holly Holland was discharged to home in good condition. [facility]  Discharge Medications:   Medication List    STOP taking these medications        loratadine 10 MG tablet  Commonly known as:   CLARITIN      TAKE these medications        ferrous fumarate 325 (106 FE) MG Tabs tablet  Commonly known as:  HEMOCYTE - 106 mg FE  Take 1 tablet (106 mg of iron total) by mouth daily.     fluticasone 50 MCG/ACT nasal spray  Commonly known as:  FLONASE  Place 2 sprays into both nostrils as needed.     ibuprofen 600 MG tablet  Commonly known as:  ADVIL,MOTRIN  Take 1 tablet (600 mg total) by mouth every 6 (six) hours as needed for mild pain.     lanolin Oint  Apply 1 application topically as needed (for breast care).     montelukast 10 MG tablet  Commonly known as:  SINGULAIR  Take 10 mg by mouth at bedtime as needed.     oxyCODONE-acetaminophen 5-325 MG tablet  Commonly known as:  PERCOCET/ROXICET  Take 1-2 tablets by mouth every 6 (six) hours as needed for moderate pain or severe pain.     prenatal multivitamin Tabs tablet  Take 1 tablet by mouth daily at 12 noon.        [facility]  Jordy Hewins, GlorietaOLLEEN, CNM

## 2015-07-01 NOTE — Anesthesia Preprocedure Evaluation (Signed)
Anesthesia Evaluation  Patient identified by MRN, date of birth, ID band Patient awake    Reviewed: Allergy & Precautions, NPO status , Patient's Chart, lab work & pertinent test results  Airway Mallampati: II  TM Distance: >3 FB Neck ROM: Full    Dental no notable dental hx.    Pulmonary neg pulmonary ROS,  Sinus allergies   Pulmonary exam normal        Cardiovascular negative cardio ROS Normal cardiovascular exam     Neuro/Psych negative neurological ROS  negative psych ROS   GI/Hepatic negative GI ROS, Neg liver ROS,   Endo/Other  negative endocrine ROS  Renal/GU negative Renal ROS  negative genitourinary   Musculoskeletal negative musculoskeletal ROS (+)   Abdominal Normal abdominal exam  (+)   Peds negative pediatric ROS (+)  Hematology negative hematology ROS (+)   Anesthesia Other Findings   Reproductive/Obstetrics (+) Pregnancy                             Anesthesia Physical Anesthesia Plan  ASA: II  Anesthesia Plan: Epidural   Post-op Pain Management:    Induction:   Airway Management Planned: Natural Airway  Additional Equipment:   Intra-op Plan:   Post-operative Plan:   Informed Consent:   Plan Discussed with: Surgeon  Anesthesia Plan Comments:         Anesthesia Quick Evaluation

## 2015-07-01 NOTE — Progress Notes (Signed)
10cc .25% Marcaine

## 2015-07-01 NOTE — Progress Notes (Signed)
  Labor Progress Note   19 y.o. G2P1010 @ 2453w6d , admitted for  Pregnancy, Labor Management.   Subjective:  No pain after epidural. IOL started last night w Cervadil, and rapidly dilated and contracted during night.  Objective:  BP 114/55 mmHg  Pulse 75  Temp(Src) 98.8 F (37.1 C) (Oral)  Resp 18  Ht 5\' 4"  (1.626 m)  Wt 192 lb (87.091 kg)  BMI 32.94 kg/m2  LMP 09/17/2014  Breastfeeding? Unknown Abd: gravid, NT  Extr: trace to 1+ bilateral pedal edema SVE: CERVIX: 9 cm dilated, 80 effaced, -2 station  EFM: FHR: 140 bpm, variability: moderate,  accelerations:  Present,  decelerations:  Absent Toco: Frequency: Every4 minutes Labs: I have reviewed the patient's lab results.   Assessment & Plan:  G2P1010 @ 7553w6d, admitted for  Pregnancy and Labor/Delivery Management  1. Pain management: epidural. 2. FWB: FHT category 1.  3. ID: GBS positive 4. Labor management: AROM clear now.  ABX in for GBS.  Expect second stage soon.  All discussed with patient, see orders

## 2015-07-02 ENCOUNTER — Encounter: Payer: Self-pay | Admitting: Obstetrics & Gynecology

## 2015-07-02 LAB — CBC
HCT: 26.1 % — ABNORMAL LOW (ref 35.0–47.0)
Hemoglobin: 8.7 g/dL — ABNORMAL LOW (ref 12.0–16.0)
MCH: 31.4 pg (ref 26.0–34.0)
MCHC: 33.2 g/dL (ref 32.0–36.0)
MCV: 94.6 fL (ref 80.0–100.0)
PLATELETS: 130 10*3/uL — AB (ref 150–440)
RBC: 2.76 MIL/uL — ABNORMAL LOW (ref 3.80–5.20)
RDW: 13.5 % (ref 11.5–14.5)
WBC: 12.1 10*3/uL — ABNORMAL HIGH (ref 3.6–11.0)

## 2015-07-02 LAB — RPR: RPR: NONREACTIVE

## 2015-07-02 NOTE — Progress Notes (Signed)
Pt used electric breast pump at this time; pumped for 15 min; pumped 20 mL colostrum from right breast and 1 mL colostrum left breast; RN assisted pt feeding pumped colostrum via syringe to baby; baby took 8 mL

## 2015-07-02 NOTE — Anesthesia Post-op Follow-up Note (Signed)
  Anesthesia Pain Follow-up Note  Patient: Holly Holland  Day #: 1  Date of Follow-up: 07/02/2015 Time: 7:25 AM  Last Vitals:  Filed Vitals:   07/01/15 2342  BP: 114/68  Pulse: 80  Temp: 37.2 C  Resp: 18    Level of Consciousness: alert  Pain: none   Side Effects:None  Catheter Site Exam:clean  Plan: D/C from anesthesia care  Rica MastBachich,  Jennifer M

## 2015-07-02 NOTE — Progress Notes (Signed)
  Subjective:  Doing well, no issues.  Pain adequately controlled.  Tolerating po.  No issues with breastfeeding.  Denies fevers, chills.    Objective:  Blood pressure 91/69, pulse 87, temperature 98.5 F (36.9 C), temperature source Oral, resp. rate 20, height 5\' 4"  (1.626 m), weight 87.091 kg (192 lb), last menstrual period 09/17/2014, SpO2 100 %, unknown if currently breastfeeding.  General: NAD Pulmonary: no increased work of breathing Abdomen: non-distended, non-tender, fundus firm at level of umbilicus Incision: currently breast feeding will re-inspect after patient done Extremities: no edema, no erythema, no tenderness  Results for orders placed or performed during the hospital encounter of 06/30/15 (from the past 72 hour(s))  Type and screen [facility]     Status: None   Collection Time: 06/30/15  8:30 PM  Result Value Ref Range   ABO/RH(D) O POS    Antibody Screen NEG    Sample Expiration 07/03/2015   Chlamydia/NGC rt PCR ([facility] only)     Status: None   Collection Time: 06/30/15  8:30 PM  Result Value Ref Range   Specimen source GC/Chlam ENDOCERVICAL    Chlamydia Tr NOT DETECTED NOT DETECTED   N gonorrhoeae NOT DETECTED NOT DETECTED    Comment: (NOTE) 100  This methodology has not been evaluated in pregnant women or in 200  patients with a history of hysterectomy. 300 400  This methodology will not be performed on patients less than 114  years of age.   ABO/Rh     Status: None   Collection Time: 06/30/15  8:30 PM  Result Value Ref Range   ABO/RH(D) O POS   CBC     Status: Abnormal   Collection Time: 06/30/15  8:31 PM  Result Value Ref Range   WBC 9.1 3.6 - 11.0 K/uL   RBC 3.29 (L) 3.80 - 5.20 MIL/uL   Hemoglobin 10.2 (L) 12.0 - 16.0 g/dL   HCT 16.130.8 (L) 09.635.0 - 04.547.0 %   MCV 93.7 80.0 - 100.0 fL   MCH 30.9 26.0 - 34.0 pg   MCHC 33.0 32.0 - 36.0 g/dL   RDW 40.913.2 81.111.5 - 91.414.5 %   Platelets 161 150 - 440 K/uL  RPR     Status: None   Collection  Time: 06/30/15  8:31 PM  Result Value Ref Range   RPR Ser Ql Non Reactive Non Reactive    Comment: (NOTE) Performed At: [facility] [address] [place], [place] [id number] Mila HomerHancock William F MD YQ:[id number]:[phone]   CBC     Status: Abnormal   Collection Time: 07/02/15  5:10 AM  Result Value Ref Range   WBC 12.1 (H) 3.6 - 11.0 K/uL   RBC 2.76 (L) 3.80 - 5.20 MIL/uL   Hemoglobin 8.7 (L) 12.0 - 16.0 g/dL   HCT 52.826.1 (L) 41.335.0 - 24.447.0 %   MCV 94.6 80.0 - 100.0 fL   MCH 31.4 26.0 - 34.0 pg   MCHC 33.2 32.0 - 36.0 g/dL   RDW 01.013.5 27.211.5 - 53.614.5 %   Platelets 130 (L) 150 - 440 K/uL     Assessment:   19 y.o. G2P1011 postoperativeday # 1 1LTCS for CPD   Plan:  1) Acute blood loss anemia - hemodynamically stable and asymptomatic - po ferrous sulfate  2)O pos / RI / VZI   3) TDAP status  Up to date / check on offer influenza vaccination  4) Breast feeding  5) Disposition anticipate discharge POD3-4

## 2015-07-02 NOTE — Anesthesia Postprocedure Evaluation (Signed)
  Anesthesia Post-op Note  Patient: Holly Holland  Procedure(s) Performed: Procedure(s): CESAREAN SECTION (N/A)  Anesthesia type:Epidural  Patient location: 339  Post pain: Pain level controlled  Post assessment: Post-op Vital signs reviewed, Patient's Cardiovascular Status Stable, Respiratory Function Stable, Patent Airway and No signs of Nausea or vomiting  Post vital signs: Reviewed and stable  Last Vitals:  Filed Vitals:   07/01/15 2342  BP: 114/68  Pulse: 80  Temp: 37.2 C  Resp: 18    Level of consciousness: awake, alert  and patient cooperative  Complications: No apparent anesthesia complications

## 2015-07-03 NOTE — Progress Notes (Signed)
  Subjective:  Doing well, no issues.  Pain adequately controlled.  Tolerating po.  No issues with breastfeeding.  Denies fevers, chills.    Objective:  Blood pressure 91/69, pulse 87, temperature 98.5 F (36.9 C), temperature source Oral, resp. rate 20, height 5\' 4"  (1.626 m), weight 87.091 kg (192 lb), last menstrual period 09/17/2014, SpO2 100 %, unknown if currently breastfeeding.  General: NAD Pulmonary: no increased work of breathing Abdomen: non-distended, non-tender, fundus firm at level of umbilicus Incision: C/D/I, OnQ intact Extremities: no edema, no erythema, no tenderness  Results for orders placed or performed during the hospital encounter of 06/30/15 (from the past 72 hour(s))  Type and screen [facility]     Status: None   Collection Time: 06/30/15  8:30 PM  Result Value Ref Range   ABO/RH(D) O POS    Antibody Screen NEG    Sample Expiration 07/03/2015   Chlamydia/NGC rt PCR ([facility] only)     Status: None   Collection Time: 06/30/15  8:30 PM  Result Value Ref Range   Specimen source GC/Chlam ENDOCERVICAL    Chlamydia Tr NOT DETECTED NOT DETECTED   N gonorrhoeae NOT DETECTED NOT DETECTED    Comment: (NOTE) 100  This methodology has not been evaluated in pregnant women or in 200  patients with a history of hysterectomy. 300 400  This methodology will not be performed on patients less than 3214  years of age.   ABO/Rh     Status: None   Collection Time: 06/30/15  8:30 PM  Result Value Ref Range   ABO/RH(D) O POS   CBC     Status: Abnormal   Collection Time: 06/30/15  8:31 PM  Result Value Ref Range   WBC 9.1 3.6 - 11.0 K/uL   RBC 3.29 (L) 3.80 - 5.20 MIL/uL   Hemoglobin 10.2 (L) 12.0 - 16.0 g/dL   HCT 16.130.8 (L) 09.635.0 - 04.547.0 %   MCV 93.7 80.0 - 100.0 fL   MCH 30.9 26.0 - 34.0 pg   MCHC 33.0 32.0 - 36.0 g/dL   RDW 40.913.2 81.111.5 - 91.414.5 %   Platelets 161 150 - 440 K/uL  RPR     Status: None   Collection Time: 06/30/15  8:31 PM  Result Value Ref  Range   RPR Ser Ql Non Reactive Non Reactive    Comment: (NOTE) Performed At: [facility] [address] [place], [place] [id number] Mila HomerHancock William F MD YQ:[id number]:[phone]   CBC     Status: Abnormal   Collection Time: 07/02/15  5:10 AM  Result Value Ref Range   WBC 12.1 (H) 3.6 - 11.0 K/uL   RBC 2.76 (L) 3.80 - 5.20 MIL/uL   Hemoglobin 8.7 (L) 12.0 - 16.0 g/dL   HCT 52.826.1 (L) 41.335.0 - 24.447.0 %   MCV 94.6 80.0 - 100.0 fL   MCH 31.4 26.0 - 34.0 pg   MCHC 33.2 32.0 - 36.0 g/dL   RDW 01.013.5 27.211.5 - 53.614.5 %   Platelets 130 (L) 150 - 440 K/uL     Assessment:   19 y.o. G2P1011 postoperativeday # 2 1LTCS for CPD   Plan:  1) Acute blood loss anemia - hemodynamically stable and asymptomatic - po ferrous sulfate  2)O pos / RI / VZI   3) TDAP status  Up to date / check on offer influenza vaccination  4) Breast feeding  5) Disposition anticipate discharge POD3-4  6) Plans Nexplanon

## 2015-07-03 NOTE — Discharge Instructions (Signed)
Cesarean Delivery, Care After  Refer to this sheet in the next few weeks. These instructions provide you with information on caring for yourself after your procedure. Your health care provider may also give you specific instructions. Your treatment has been planned according to current medical practices, but problems sometimes occur. Call your health care provider if you have any problems or questions after you go home.  HOME CARE INSTRUCTIONS   Only take over-the-counter or prescription medications as directed by your health care provider.   Do not drink alcohol, especially if you are breastfeeding or taking medication to relieve pain.   Do not chew or smoke tobacco.   Continue to use good perineal care. Good perineal care includes:    Wiping your perineum from front to back.    Keeping your perineum clean.   Check your surgical cut (incision) daily for increased redness, drainage, swelling, or separation of skin.   Clean your incision gently with soap and water every day, and then pat it dry. If your health care provider says it is okay, leave the incision uncovered. Use a bandage (dressing) if the incision is draining fluid or appears irritated. If the adhesive strips across the incision do not fall off within 7 days, carefully peel them off.   Hug a pillow when coughing or sneezing until your incision is healed. This helps to relieve pain.   Do not use tampons or douche until your health care provider says it is okay.   Shower, wash your hair, and take tub baths as directed by your health care provider.   Wear a well-fitting bra that provides breast support.   Limit wearing support panties or control-top hose.   Drink enough fluids to keep your urine clear or pale yellow.   Eat high-fiber foods such as whole grain cereals and breads, brown rice, beans, and fresh fruits and vegetables every day. These foods may help prevent or relieve constipation.   Resume activities such as climbing stairs,  driving, lifting, exercising, or traveling as directed by your health care provider.   Talk to your health care provider about resuming sexual activities. This is dependent upon your risk of infection, your rate of healing, and your comfort and desire to resume sexual activity.   Try to have someone help you with your household activities and your newborn for at least a few days after you leave the hospital.   Rest as much as possible. Try to rest or take a nap when your newborn is sleeping.   Increase your activities gradually.   Keep all of your scheduled postpartum appointments. It is very important to keep your scheduled follow-up appointments. At these appointments, your health care provider will be checking to make sure that you are healing physically and emotionally.  SEEK MEDICAL CARE IF:    You are passing large clots from your vagina. Save any clots to show your health care provider.   You have a foul smelling discharge from your vagina.   You have trouble urinating.   You are urinating frequently.   You have pain when you urinate.   You have a change in your bowel movements.   You have increasing redness, pain, or swelling near your incision.   You have pus draining from your incision.   Your incision is separating.   You have painful, hard, or reddened breasts.   You have a severe headache.   You have blurred vision or see spots.   You feel sad   or depressed.   You have thoughts of hurting yourself or your newborn.   You have questions about your care, the care of your newborn, or medications.   You are dizzy or light-headed.   You have a rash.   You have pain, redness, or swelling at the site of the removed intravenous access (IV) tube.   You have nausea or vomiting.   You stopped breastfeeding and have not had a menstrual period within 12 weeks of stopping.   You are not breastfeeding and have not had a menstrual period within 12 weeks of delivery.   You have a fever.  SEEK  IMMEDIATE MEDICAL CARE IF:   You have persistent pain.   You have chest pain.   You have shortness of breath.   You faint.   You have leg pain.   You have stomach pain.   Your vaginal bleeding saturates 2 or more sanitary pads in 1 hour.  MAKE SURE YOU:    Understand these instructions.   Will watch your condition.   Will get help right away if you are not doing well or get worse.     This information is not intended to replace advice given to you by your health care provider. Make sure you discuss any questions you have with your health care provider.     Document Released: 05/16/2002 Document Revised: 09/14/2014 Document Reviewed: 04/20/2012  Elsevier Interactive Patient Education 2016 [employer]

## 2015-07-04 DIAGNOSIS — D62 Acute posthemorrhagic anemia: Secondary | ICD-10-CM | POA: Diagnosis not present

## 2015-07-04 MED ORDER — FERROUS FUMARATE 325 (106 FE) MG PO TABS: 1.0000 | ORAL_TABLET | Freq: Every day | ORAL | Status: DC

## 2015-07-04 MED ORDER — DOCUSATE SODIUM 100 MG PO CAPS: 100.0000 mg | ORAL_CAPSULE | Freq: Two times a day (BID) | ORAL | Status: DC | PRN

## 2015-07-04 MED ORDER — LANOLIN HYDROUS EX OINT: 1.0000 "application " | TOPICAL_OINTMENT | CUTANEOUS | Status: DC | PRN

## 2015-07-04 MED ORDER — OXYCODONE-ACETAMINOPHEN 5-325 MG PO TABS: 1.0000 | ORAL_TABLET | Freq: Four times a day (QID) | ORAL | Status: DC | PRN

## 2015-07-04 MED ORDER — IBUPROFEN 600 MG PO TABS: 600.0000 mg | ORAL_TABLET | Freq: Four times a day (QID) | ORAL | Status: DC | PRN

## 2015-07-04 MED ORDER — FERROUS FUMARATE 325 (106 FE) MG PO TABS
1.0000 | ORAL_TABLET | Freq: Every day | ORAL | Status: DC
  Administered 2015-07-04: 106 mg via ORAL
  Filled 2015-07-04 (×2): qty 1

## 2015-07-08 NOTE — Addendum Note (Signed)
Addendum  created 07/08/15 1334 by Stormy FabianLinda Wells Mabe, CRNA   Modules edited: Notes Section   Notes Section:  File: [id number]

## 2015-12-10 ENCOUNTER — Encounter: Payer: Self-pay | Admitting: Emergency Medicine

## 2015-12-10 ENCOUNTER — Emergency Department
Admission: EM | Admit: 2015-12-10 | Discharge: 2015-12-10 | Disposition: A | Discharge status: Home / Self Care | Payer: Medicaid Other | Attending: Emergency Medicine | Admitting: Emergency Medicine

## 2015-12-10 DIAGNOSIS — D649 Anemia, unspecified: Secondary | ICD-10-CM

## 2015-12-10 DIAGNOSIS — M545 Low back pain, unspecified: Secondary | ICD-10-CM

## 2015-12-10 LAB — URINALYSIS COMPLETE WITH MICROSCOPIC (ARMC ONLY)
BILIRUBIN URINE: NEGATIVE
Bacteria, UA: NONE SEEN
Glucose, UA: NEGATIVE mg/dL
KETONES UR: NEGATIVE mg/dL
Leukocytes, UA: NEGATIVE
NITRITE: NEGATIVE
PH: 6 (ref 5.0–8.0)
PROTEIN: NEGATIVE mg/dL
SPECIFIC GRAVITY, URINE: 1.024 (ref 1.005–1.030)

## 2015-12-10 LAB — BASIC METABOLIC PANEL
ANION GAP: 5 (ref 5–15)
BUN: 14 mg/dL (ref 6–20)
CALCIUM: 8.8 mg/dL — AB (ref 8.9–10.3)
CO2: 26 mmol/L (ref 22–32)
CREATININE: 0.89 mg/dL (ref 0.44–1.00)
Chloride: 106 mmol/L (ref 101–111)
Glucose, Bld: 86 mg/dL (ref 65–99)
Potassium: 3.8 mmol/L (ref 3.5–5.1)
SODIUM: 137 mmol/L (ref 135–145)

## 2015-12-10 LAB — POCT PREGNANCY, URINE: Preg Test, Ur: NEGATIVE

## 2015-12-10 LAB — CBC
HCT: 32.4 % — ABNORMAL LOW (ref 35.0–47.0)
HEMOGLOBIN: 11 g/dL — AB (ref 12.0–16.0)
MCH: 30.8 pg (ref 26.0–34.0)
MCHC: 33.9 g/dL (ref 32.0–36.0)
MCV: 90.8 fL (ref 80.0–100.0)
PLATELETS: 305 10*3/uL (ref 150–440)
RBC: 3.57 MIL/uL — AB (ref 3.80–5.20)
RDW: 13.1 % (ref 11.5–14.5)
WBC: 4.4 10*3/uL (ref 3.6–11.0)

## 2015-12-10 MED ORDER — IBUPROFEN 800 MG PO TABS: 800.0000 mg | ORAL_TABLET | Freq: Three times a day (TID) | ORAL | Status: DC | PRN

## 2015-12-10 MED ORDER — KETOROLAC TROMETHAMINE 30 MG/ML IJ SOLN: 60.0000 mg | Freq: Once | INTRAMUSCULAR | Status: DC

## 2015-12-10 MED ORDER — KETOROLAC TROMETHAMINE 60 MG/2ML IM SOLN
INTRAMUSCULAR | Status: AC
  Administered 2015-12-10: 60 mg
  Filled 2015-12-10: qty 2

## 2015-12-10 NOTE — ED Notes (Signed)
Pt on med hold until 1834.

## 2015-12-10 NOTE — Discharge Instructions (Signed)
You may apply a heating pad to your back for 10 minutes every 2 hours.  For pain, you may take Tylenol and Motrin.  Please return to the emergency department if you develop severe pain, vomiting, fever, or any other symptoms concerning to you.

## 2015-12-10 NOTE — ED Notes (Signed)
UA Preg result = Neg

## 2015-12-10 NOTE — ED Notes (Signed)
Patient presents to the ED with right flank pain that began today.  Patient reports pain is worse when she eats and when she coughs.  Patient denies dysuria.  Patient states occasionally the pain is very sharp and occasionally it radiates into her abdomen.  Patient is in no obvious distress at this time.

## 2015-12-10 NOTE — ED Provider Notes (Signed)
[facility] Emergency Department Provider Note  ____________________________________________  Time seen: Approximately 6:06 PM  I have reviewed the triage vital signs and the nursing notes.   HISTORY  Chief Complaint Flank Pain    HPI Holly Holland is a 20 y.o. female , not currently breast-feeding her infant, presenting with midline back pain that radiates to the right and left low back. Patient was driving home from work when she had the onset of a sharp midline pain that would radiate into the right or the left side. She had no associated nausea or vomiting, fever or chills, urinary symptoms. She has never had kidney stones in the past. She has been having a chronic cough from seasonal allergies, and is also carrying her infant a lot.She has not tried anything for the pain. History of sciatica during her pregnancy.   Past Medical History  Diagnosis Date  . Allergy     Patient Active Problem List   Diagnosis Date Noted  . Acute blood loss anemia 07/04/2015  . S/P cesarean section 07/01/2015  . Low TSH level 07/05/2014    Past Surgical History  Procedure Laterality Date  . Wisdom tooth extraction    . Cesarean section N/A 07/01/2015    Procedure: CESAREAN SECTION;  Surgeon: Nadara Mustard, MD;  Location: [facility] ORS;  Service: Obstetrics;  Laterality: N/A;    Current Outpatient Rx  Name  Route  Sig  Dispense  Refill  . ferrous fumarate (HEMOCYTE - 106 MG FE) 325 (106 FE) MG TABS tablet   Oral   Take 1 tablet (106 mg of iron total) by mouth daily.   30 each   0   . fluticasone (FLONASE) 50 MCG/ACT nasal spray   Each Nare   Place 2 sprays into both nostrils as needed.          Marland Kitchen ibuprofen (ADVIL,MOTRIN) 800 MG tablet   Oral   Take 1 tablet (800 mg total) by mouth every 8 (eight) hours as needed.   20 tablet   0   . lanolin OINT   Topical   Apply 1 application topically as needed (for breast care).      0   . montelukast (SINGULAIR)  10 MG tablet   Oral   Take 10 mg by mouth at bedtime as needed.          Marland Kitchen oxyCODONE-acetaminophen (PERCOCET/ROXICET) 5-325 MG tablet   Oral   Take 1-2 tablets by mouth every 6 (six) hours as needed for moderate pain or severe pain.   30 tablet   0   . Prenatal Vit-Fe Fumarate-FA (PRENATAL MULTIVITAMIN) TABS tablet   Oral   Take 1 tablet by mouth daily at 12 noon.           Allergies Review of patient's allergies indicates no known allergies.  Family History  Problem Relation Age of Onset  . Asthma Maternal Aunt   . Hypertension Paternal Grandmother     Social History Social History  Substance Use Topics  . Smoking status: Never Smoker   . Smokeless tobacco: Never Used  . Alcohol Use: No    Review of Systems Constitutional: No fever/chills. No lightheadedness or syncope. Eyes: No visual changes. ENT: No sore throat. No congestion or rhinorrhea. Cardiovascular: Denies chest pain. Denies palpitations. Respiratory: Denies shortness of breath.  No cough. Gastrointestinal: No abdominal pain.  No nausea, no vomiting.  No diarrhea.  No constipation. Genitourinary: Negative for dysuria. Musculoskeletal: Positive for back pain.  Skin: Negative for rash. Neurological: Negative for headaches. No focal numbness, tingling or weakness.   10-point ROS otherwise negative.  ____________________________________________   PHYSICAL EXAM:  VITAL SIGNS: ED Triage Vitals  Enc Vitals Group     BP 12/10/15 1442 120/64 mmHg     Pulse Rate 12/10/15 1442 91     Resp 12/10/15 1442 18     Temp 12/10/15 1442 99.1 F (37.3 C)     Temp Source 12/10/15 1442 Oral     SpO2 12/10/15 1442 98 %     Weight 12/10/15 1442 160 lb (72.576 kg)     Height 12/10/15 1442  (1.626 m)     Head Cir --      Peak Flow --      Pain Score 12/10/15 1443 8     Pain Loc --      Pain Edu? --      Excl. in GC? --     Constitutional: Alert and oriented. Well appearing and in no acute distress.  Answers questions appropriately. Eyes: Conjunctivae are normal.  EOMI. No scleral icterus. Head: Atraumatic. Nose: No congestion/rhinnorhea. Mouth/Throat: Mucous membranes are moist.  Neck: No stridor.  Supple.  No JVD. Cardiovascular: Normal rate, regular rhythm. No murmurs, rubs or gallops.  Respiratory: Normal respiratory effort.  No accessory muscle use or retractions. Lungs CTAB.  No wheezes, rales or ronchi. Gastrointestinal: Soft, nontender and nondistended.  No guarding or rebound.  No peritoneal signs. No tenderness to palpation in the costovertebral angles bilaterally. Musculoskeletal: No LE edema. No ttp in the calves or palpable cords.  Negative Homan's sign. No cervical or thoracic spine tenderness. No lumbar spine tenderness to palpation, or paraspinal tenderness to palpation. I am unable to reproduce the patient's pain. A less severe version of the pain occurs with lateral movements of the back.  Neurologic:  A&Ox3.  Speech is clear.  Face and smile are symmetric.  EOMI.  Moves all extremities well. Normal gait without ataxia. Skin:  Skin is warm, dry and intact. No rash noted. Psychiatric: Mood and affect are normal. Speech and behavior are normal.  Normal judgement.  ____________________________________________   LABS (all labs ordered are listed, but only abnormal results are displayed)  Labs Reviewed  BASIC METABOLIC PANEL - Abnormal; Notable for the following:    Calcium 8.8 (*)    All other components within normal limits  CBC - Abnormal; Notable for the following:    RBC 3.57 (*)    Hemoglobin 11.0 (*)    HCT 32.4 (*)    All other components within normal limits  URINALYSIS COMPLETEWITH MICROSCOPIC ([facility] ONLY) - Abnormal; Notable for the following:    Color, Urine YELLOW (*)    APPearance HAZY (*)    Hgb urine dipstick 3+ (*)    Squamous Epithelial / LPF 0-5 (*)    All other components within normal limits  POC URINE PREG, ED  POCT PREGNANCY, URINE    ____________________________________________  EKG  Not indicated ____________________________________________  RADIOLOGY  No results found.  ____________________________________________   PROCEDURES  Procedure(s) performed: None  Critical Care performed: No ____________________________________________   INITIAL IMPRESSION / ASSESSMENT AND PLAN / ED COURSE  Pertinent labs & imaging results that were available during my care of the patient were reviewed by me and considered in my medical decision making (see chart for details).  20 y.o. female with midline back pain radiating to the right and left side, worse with positional changes, no setting of  recent chronic dry cough and caring her infant. She has multiple reasons to have musculoskeletal strain today. She has a history of sciatica with her previous pregnancy although was different because it did radiate into her leg. The patient's urinalysis is not consistent with pyelonephritis. I do not think she has a kidney stone, as it would be unusual to have bilateral pain and the midline pain is unusual as well. The patient has blood in her urine but is menstruating today. The patient otherwise has stable vital signs and is well-appearing. I will plan to discharge her home with anti-inflammatories as well as instructions for heat therapy, and have her follow-up with her primary care physician.  ____________________________________________  FINAL CLINICAL IMPRESSION(S) / ED DIAGNOSES  Final diagnoses:  Midline low back pain      NEW MEDICATIONS STARTED DURING THIS VISIT:  New Prescriptions   IBUPROFEN (ADVIL,MOTRIN) 800 MG TABLET    Take 1 tablet (800 mg total) by mouth every 8 (eight) hours as needed.     Rockne MenghiniAnne-Caroline Cricket Goodlin, MD 12/10/15 1810

## 2017-12-02 ENCOUNTER — Ambulatory Visit: Payer: Self-pay | Admitting: Family Medicine

## 2018-01-06 ENCOUNTER — Ambulatory Visit: Payer: Self-pay | Admitting: Family Medicine

## 2018-02-08 ENCOUNTER — Telehealth: Payer: Self-pay

## 2018-02-08 NOTE — Telephone Encounter (Signed)
PT AWARE December 2019 FOR NEXPLANON removal

## 2018-02-08 NOTE — Telephone Encounter (Signed)
Pt calling to get her expiration date on her birth control. ZO#[phone]#[phone]

## 2018-05-17 ENCOUNTER — Telehealth: Payer: Self-pay | Admitting: Certified Nurse Midwife

## 2018-05-17 NOTE — Telephone Encounter (Signed)
Pt coming in on 08/10/18 at 1:50 with CLG for nexplanon

## 2018-05-17 NOTE — Telephone Encounter (Signed)
Noted. Will order to arrive by apt date/time. 

## 2018-06-14 ENCOUNTER — Ambulatory Visit (INDEPENDENT_AMBULATORY_CARE_PROVIDER_SITE_OTHER): Payer: 59 | Admitting: Certified Nurse Midwife

## 2018-06-14 ENCOUNTER — Encounter: Payer: Self-pay | Admitting: Certified Nurse Midwife

## 2018-06-14 ENCOUNTER — Other Ambulatory Visit (HOSPITAL_COMMUNITY)
Admission: RE | Admit: 2018-06-14 | Discharge: 2018-06-14 | Disposition: A | Discharge status: Home / Self Care | Payer: 59 | Source: Ambulatory Visit | Attending: Certified Nurse Midwife | Admitting: Certified Nurse Midwife

## 2018-06-14 VITALS — BP 110/60 | HR 72 | Ht 64.0 in | Wt 155.0 lb

## 2018-06-14 DIAGNOSIS — Z01419 Encounter for gynecological examination (general) (routine) without abnormal findings: Secondary | ICD-10-CM

## 2018-06-14 DIAGNOSIS — Z124 Encounter for screening for malignant neoplasm of cervix: Secondary | ICD-10-CM

## 2018-06-14 DIAGNOSIS — Z113 Encounter for screening for infections with a predominantly sexual mode of transmission: Secondary | ICD-10-CM | POA: Diagnosis not present

## 2018-06-14 NOTE — Progress Notes (Addendum)
Gynecology Annual Exam  PCP: Alba Cory, MD  Chief Complaint:  Chief Complaint  Patient presents with  . Gynecologic Exam    right breast is sore; renew bc    History of Present Illness: Holly Holland is a 22 y.o. G2P1101 BF who  presents for a gyn exam. The patient has also been experiencing some discomfort in her right axillary/ right upper arm, right upper chest wall x 1 day. Denies injury or straining to lift with the right arm.   Her menses are irregular, they occur every 3-6 weeks, and they last 5-7 days. Her flow is moderate. She does not have intermenstrual bleeding. Her last menstrual period was 20 Sept 2019. She has dysmenorrhea the first two days of her menses. She uses a heating pad or some ibuprofen with relief. Last pap smear: NA, this is her first Pap smear.   The patient is sexually active. She currently uses Nexplanon for contraception. Her Nexplanon was inserted 08/09/2015 and will be expiring the end of this year. She desires that the Nexplanon be replaced.  Since her last visit, she has had no significant changes in her health.  Her past medical history is remarkable for having a Cesarean section in 2016 for CPD.   The patient does perform self breast exams. Her last mammogram was NA  There is no family history of breast cancer.   There is no family history of ovarian cancer.   The patient denies smoking.  She denies drinking alcohol.   She denies illegal drug use.  The patient does not exercise.but has an active lifestyle  The patient denies current symptoms of depression.    Review of Systems: ROS  Past Medical History:  Past Medical History:  Diagnosis Date  . Allergy   . 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 IUFD at 20 weeks or more of gestation 06/18/2013   23WKS    Past Surgical History:  Past Surgical History:  Procedure Laterality Date  . CESAREAN SECTION N/A 07/01/2015   Procedure: CESAREAN SECTION;  Surgeon: Nadara Mustard, MD;  Location: [facility] ORS;  Service: Obstetrics;  Laterality: N/A;  . WISDOM TOOTH EXTRACTION      Family History:  Family History  Problem Relation Age of Onset  . Asthma Maternal Aunt   . Hypertension Paternal Grandmother   . Stroke Paternal Grandmother   . Diabetes Other     Social History:  Social History   Socioeconomic History  . Marital status: Single    Spouse name: Not on file  . Number of children: 1  . Years of education: 39  . Highest education level: Not on file  Occupational History  . Occupation: CNA  Social Needs  . Financial resource strain: Not on file  . Food insecurity:    Worry: Not on file    Inability: Not on file  . Transportation needs:    Medical: Not on file    Non-medical: Not on file  Tobacco Use  . Smoking status: Never Smoker  . Smokeless tobacco: Never Used  Substance and Sexual Activity  . Alcohol use: No  . Drug use: No  . Sexual activity: Yes    Birth control/protection: Implant  Lifestyle  . Physical activity:    Days per week: Not on file    Minutes per session: Not on file  . Stress: Not on file  Relationships  . Social connections:  Talks on phone: Not on file    Gets together: Not on file    Attends religious service: Not on file    Active member of club or organization: Not on file    Attends meetings of clubs or organizations: Not on file    Relationship status: Not on file  . Intimate partner violence:    Fear of current or ex partner: Not on file    Emotionally abused: Not on file    Physically abused: Not on file    Forced sexual activity: Not on file  Other Topics Concern  . Not on file  Social History Narrative  . Not on file    Allergies:  Allergies  Allergen Reactions  . Other     Pollen - trees, grasses, flowers    Medications: Prior to Admission medications   Medication Sig Start Date End Date Taking? Authorizing Provider  ferrous  fumarate (HEMOCYTE - 106 MG FE) 325 (106 FE) MG TABS tablet Take 1 tablet (106 mg of iron total) by mouth daily. 07/04/15   Farrel Conners, CNM  fluticasone (FLONASE) 50 MCG/ACT nasal spray Place 2 sprays into both nostrils as needed.     [provider]  ibuprofen (ADVIL,MOTRIN) 800 MG tablet Take 1 tablet (800 mg total) by mouth every 8 (eight) hours as needed. 12/10/15   Rockne Menghini, MD  lanolin OINT Apply 1 application topically as needed (for breast care). 07/04/15   Farrel Conners, CNM  montelukast (SINGULAIR) 10 MG tablet Take 10 mg by mouth at bedtime as needed.     [provider]  oxyCODONE-acetaminophen (PERCOCET/ROXICET) 5-325 MG tablet Take 1-2 tablets by mouth every 6 (six) hours as needed for moderate pain or severe pain. 07/04/15   Farrel Conners, CNM  Prenatal Vit-Fe Fumarate-FA (PRENATAL MULTIVITAMIN) TABS tablet Take 1 tablet by mouth daily at 12 noon.    [provider]    Physical Exam Vitals: BP 110/60   Pulse 72   Ht 5\' 4"  (1.626 m)   Wt 155 lb (70.3 kg)   LMP 05/27/2018   BMI 26.61 kg/m   General: BF in NAD HEENT: normocephalic, anicteric Neck: no thyroid enlargement, no palpable nodules, no cervical lymphadenopathy  Pulmonary: No increased work of breathing, CTAB Cardiovascular: RRR, without murmur  Breast: Breast symmetrical, no tenderness, no palpable nodules or masses, no skin or nipple retraction on left, no nipple discharge.  The right nipple has been slightly larger in size and partially inverted since breastfeeding. No axillary, infraclavicular or supraclavicular lymphadenopathy. Abdomen: Soft, non-tender, non-distended.  Umbilicus without lesions.  No hepatomegaly or masses palpable. No evidence of hernia. Genitourinary:  External: Normal external female genitalia.  Normal urethral meatus, normal Bartholin's and Skene's glands.    Vagina: Normal vaginal mucosa, no evidence of prolapse, mucoepithelial discharge     Cervix: Grossly normal in appearance, no bleeding, non-tender  Uterus: Retroflexed, normal size, shape, and consistency, immobile, and non-tender  Adnexa: No adnexal masses, non-tender  Rectal: deferred  Lymphatic: no evidence of inguinal lymphadenopathy Extremities: no edema, erythema, or tenderness. Nexplanon palpated in left arm. Neurologic: Grossly intact Psychiatric: mood appropriate, affect full     Assessment: 22 y.o. G2P1101 normal gyn exam   Plan:   1) Breast cancer screening - recommend monthly self breast exam.   2) STI screening was done  3) Cervical cancer screening - Pap was done.   4) Contraception -Plans replacing Nexplanon in 2-3 months  5) RTO in 2-3 months for Nexplanon  replacement.  Farrel Conners, CNM

## 2018-06-17 LAB — CYTOLOGY - PAP
Chlamydia: NEGATIVE
HPV: DETECTED — AB
NEISSERIA GONORRHEA: NEGATIVE
TRICH (WINDOWPATH): NEGATIVE

## 2018-08-10 ENCOUNTER — Encounter: Payer: Self-pay | Admitting: Certified Nurse Midwife

## 2018-08-10 ENCOUNTER — Ambulatory Visit (INDEPENDENT_AMBULATORY_CARE_PROVIDER_SITE_OTHER): Payer: 59 | Admitting: Certified Nurse Midwife

## 2018-08-10 VITALS — BP 120/58 | Ht 63.0 in | Wt 151.0 lb

## 2018-08-10 DIAGNOSIS — Z3046 Encounter for surveillance of implantable subdermal contraceptive: Secondary | ICD-10-CM

## 2018-08-10 DIAGNOSIS — Z30017 Encounter for initial prescription of implantable subdermal contraceptive: Secondary | ICD-10-CM

## 2018-08-10 DIAGNOSIS — Z3049 Encounter for surveillance of other contraceptives: Secondary | ICD-10-CM | POA: Diagnosis not present

## 2018-08-10 NOTE — Progress Notes (Signed)
  Holly Holland is a 22 year old G2 P1101 who presents for a Nexplanon replacement. Her current Nexplanon was placed 08/08/2018 and is expiring. Patient given informed consent, signed copy in the chart, time out was performed. Explained risks of infection and bleeding The Nexplanon was easily palpated in the patient's left arm and a mark was made over both ends. The skin was cleansed with a Betadine solution. A small injection of subcutaneous lidocaine 1% was given over the distal end of the implant. An incision was made at the end of the implant. The rod was noted in the incision and grasped with a hemostat. It was noted to be intact.     Then the remaining 1.5 ml  of 1% lidocaine was injected through the incision along the tract where the new Nexplanon will be inserted.  Nexplanon removed form packaging,  Device confirmed in needle, then inserted full length of needle and withdrawn per handbook instructions. Nexplanon palpated by provider and patient.  Insertion site covered with steri-strip and a bandaid, then a pressure dressing was applied.  Minimal blood loss.  Pt tolerated the procedure well. Given post insertion instructions.    Holly Holland ,MD 08/10/2018,9:39 PM

## 2019-07-28 ENCOUNTER — Ambulatory Visit (LOCAL_COMMUNITY_HEALTH_CENTER): Payer: Self-pay

## 2019-07-28 ENCOUNTER — Other Ambulatory Visit: Payer: Self-pay

## 2019-07-28 DIAGNOSIS — Z111 Encounter for screening for respiratory tuberculosis: Secondary | ICD-10-CM

## 2019-07-31 ENCOUNTER — Other Ambulatory Visit: Payer: Self-pay

## 2019-07-31 ENCOUNTER — Ambulatory Visit (LOCAL_COMMUNITY_HEALTH_CENTER): Payer: Self-pay

## 2019-07-31 DIAGNOSIS — Z111 Encounter for screening for respiratory tuberculosis: Secondary | ICD-10-CM

## 2019-07-31 LAB — TB SKIN TEST
Induration: 0 mm
TB Skin Test: NEGATIVE

## 2019-09-13 ENCOUNTER — Other Ambulatory Visit: Payer: 59

## 2019-10-24 NOTE — Telephone Encounter (Signed)
Nexplanon rcvd/charged 08/10/18 

## 2020-11-21 ENCOUNTER — Emergency Department: Payer: No Typology Code available for payment source

## 2020-11-21 ENCOUNTER — Other Ambulatory Visit: Payer: Self-pay

## 2020-11-21 ENCOUNTER — Emergency Department
Admission: EM | Admit: 2020-11-21 | Discharge: 2020-11-21 | Disposition: A | Discharge status: Home / Self Care | Payer: No Typology Code available for payment source | Attending: Student in an Organized Health Care Education/Training Program | Admitting: Student in an Organized Health Care Education/Training Program

## 2020-11-21 DIAGNOSIS — S8012XA Contusion of left lower leg, initial encounter: Secondary | ICD-10-CM | POA: Diagnosis not present

## 2020-11-21 DIAGNOSIS — S0990XA Unspecified injury of head, initial encounter: Secondary | ICD-10-CM | POA: Insufficient documentation

## 2020-11-21 DIAGNOSIS — Y92410 Unspecified street and highway as the place of occurrence of the external cause: Secondary | ICD-10-CM | POA: Insufficient documentation

## 2020-11-21 DIAGNOSIS — M542 Cervicalgia: Secondary | ICD-10-CM | POA: Insufficient documentation

## 2020-11-21 DIAGNOSIS — M25561 Pain in right knee: Secondary | ICD-10-CM

## 2020-11-21 DIAGNOSIS — S8991XA Unspecified injury of right lower leg, initial encounter: Secondary | ICD-10-CM | POA: Diagnosis present

## 2020-11-21 DIAGNOSIS — S8001XA Contusion of right knee, initial encounter: Secondary | ICD-10-CM | POA: Insufficient documentation

## 2020-11-21 LAB — POC URINE PREG, ED: Preg Test, Ur: NEGATIVE

## 2020-11-21 MED ORDER — CYCLOBENZAPRINE HCL 5 MG PO TABS: 5.0000 mg | ORAL_TABLET | Freq: Three times a day (TID) | ORAL | 0 refills | Status: DC | PRN

## 2020-11-21 MED ORDER — ACETAMINOPHEN 500 MG PO TABS
1000.0000 mg | ORAL_TABLET | Freq: Once | ORAL | Status: AC
  Administered 2020-11-21: 1000 mg via ORAL
  Filled 2020-11-21: qty 2

## 2020-11-21 NOTE — ED Notes (Signed)
Partner at bedside

## 2020-11-21 NOTE — ED Notes (Signed)
ED EKG performed. 
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
## 2021-03-31 ENCOUNTER — Encounter: Payer: Self-pay | Admitting: Family Medicine

## 2021-03-31 ENCOUNTER — Other Ambulatory Visit: Payer: Self-pay

## 2021-03-31 ENCOUNTER — Ambulatory Visit: Payer: Self-pay | Admitting: Family Medicine

## 2021-03-31 DIAGNOSIS — Z113 Encounter for screening for infections with a predominantly sexual mode of transmission: Secondary | ICD-10-CM

## 2021-03-31 LAB — WET PREP FOR TRICH, YEAST, CLUE
Trichomonas Exam: NEGATIVE
Yeast Exam: NEGATIVE

## 2021-03-31 NOTE — Progress Notes (Signed)
[facility] Department STI clinic/screening visit  Subjective:  BRADYN VASSEY is a 25 y.o. female being seen today for an STI screening visit. The patient reports they do not have symptoms.  Patient reports that they do not desire a pregnancy in the next year.   They reported they are not interested in discussing contraception today.  Patient's last menstrual period was 03/11/2021.   Patient has the following medical conditions:   Patient Active Problem List   Diagnosis Date Noted   Acute blood loss anemia 07/04/2015   S/P cesarean section 07/01/2015   Low TSH level 07/05/2014    Chief Complaint  Patient presents with   SEXUALLY TRANSMITTED DISEASE    screening    HPI  Patient reports here for screening, denies s/sx.     Last HIV test per patient/review of record was 2021 Patient reports last pap was 2019.  See flowsheet for further details and programmatic requirements.    The following portions of the patient's history were reviewed and updated as appropriate: allergies, current medications, past medical history, past social history, past surgical history and problem list.  Objective:  There were no vitals filed for this visit.  Physical Exam Vitals and nursing note reviewed.  Constitutional:      Appearance: Normal appearance.  HENT:     Head: Normocephalic and atraumatic.     Mouth/Throat:     Mouth: Mucous membranes are moist.     Pharynx: Oropharynx is clear. No oropharyngeal exudate or posterior oropharyngeal erythema.  Pulmonary:     Effort: Pulmonary effort is normal.  Chest:  Breasts:    Right: No axillary adenopathy or supraclavicular adenopathy.     Left: No axillary adenopathy or supraclavicular adenopathy.  Abdominal:     General: Abdomen is flat.     Palpations: There is no mass.     Tenderness: There is no abdominal tenderness. There is no rebound.  Genitourinary:    Exam position: Lithotomy position.     Pubic Area: No rash or  pubic lice.      Labia:        Right: No rash or lesion.        Left: No rash or lesion.      Vagina: Normal. No vaginal discharge, erythema, bleeding or lesions.     Cervix: No cervical motion tenderness, discharge, friability, lesion or erythema.     Uterus: Normal.      Adnexa: Right adnexa normal and left adnexa normal.     Comments: Deferred, pt self collected.  Lymphadenopathy:     Head:     Right side of head: No preauricular or posterior auricular adenopathy.     Left side of head: No preauricular or posterior auricular adenopathy.     Cervical: No cervical adenopathy.     Upper Body:     Right upper body: No supraclavicular or axillary adenopathy.     Left upper body: No supraclavicular or axillary adenopathy.     Lower Body: No right inguinal adenopathy. No left inguinal adenopathy.  Skin:    General: Skin is warm and dry.     Findings: No rash.  Neurological:     Mental Status: She is alert and oriented to person, place, and time.  Psychiatric:        Behavior: Behavior normal.     Assessment and Plan:  SHALEY LEAVENS is a 25 y.o. female presenting to the [facility] Department for STI screening  1. Screening examination for venereal disease  - Chlamydia/Gonorrhea [facility] - HIV [place] [facility] - WET PREP FOR TRICH, YEAST, CLUE - Syphilis Serology, [place] [facility] Patient accepted all screenings including wet prep, vaginal CT/GC and bloodwork for HIV/RPR.  Patient meets criteria for HepB screening? No. Ordered? No - does not meet criteria  Patient meets criteria for HepC screening? No. Ordered? No - does not meet criteria   Wet prep results neg    No Treatment needed  Discussed time line for State Lab results and that patient will be called with positive results and encouraged patient to call if she had not heard in 2 weeks.  Counseled to return or seek care for continued or worsening symptoms Recommended condom use with all sex  Patient is currently  using *Nexplanon to prevent pregnancy.      Return for as needed.  No future appointments.  Wendi Snipes, FNP

## 2021-03-31 NOTE — Progress Notes (Signed)
Pt here for STD screening.  Wet mount results reviewed, no treatment required.  Karam Dunson M Rockland Kotarski, RN ? ?

## 2021-04-03 LAB — HM HIV SCREENING LAB: HM HIV Screening: NEGATIVE

## 2021-08-19 ENCOUNTER — Ambulatory Visit (INDEPENDENT_AMBULATORY_CARE_PROVIDER_SITE_OTHER): Payer: 59 | Admitting: Obstetrics

## 2021-08-19 ENCOUNTER — Other Ambulatory Visit: Payer: Self-pay

## 2021-08-19 ENCOUNTER — Encounter: Payer: Self-pay | Admitting: Obstetrics

## 2021-08-19 VITALS — BP 118/74 | Ht 65.0 in

## 2021-08-19 DIAGNOSIS — Z975 Presence of (intrauterine) contraceptive device: Secondary | ICD-10-CM | POA: Insufficient documentation

## 2021-08-19 DIAGNOSIS — Z3046 Encounter for surveillance of implantable subdermal contraceptive: Secondary | ICD-10-CM

## 2021-08-19 NOTE — Progress Notes (Signed)
Holly Holland presents today for Nexplanon removal, and replacement. She does not have periods with her implant. She is overdue for an annual GYN physical, and plans to make an appointment to have a pap and pelvic after the holidays.  BP 118/74    Ht 5\' 5"  (1.651 m)    LMP  (Exact Date)    BMI 28.29 kg/m  No results found for this or any previous visit (from the past 24 hour(s)). UPT is negative.  GYNECOLOGY PROCEDURE NOTE  Nexplanon removal discussed in detail.  Risks of infection, bleeding, nerve injury all reviewed.  Patient understands risks and desires to proceed.  Verbal consent obtained.  Patient is certain she wants the Nexplanon removed.  All questions answered.  Procedure: Patient placed in dorsal supine with left arm above head, elbow flexed at 90 degrees, arm resting on examination table.  Nexplanon identified without problems.  Betadine scrub x3.  1 ml of 1% lidocaine injected under Nexplanondevice without problems.  Sterile gloves applied.  Small 0.5cm incision made at distal tip of Nexplanon device with 11 blade scalpel.  Nexplanon brought to incision and grasped with a small kelly clamp.  Nexplanon removed intact without problems.  Pressure applied to incision.  Hemostasis obtained.  Steri-strips applied, followed by bandage and compression dressing.  Patient tolerated procedure well.  No complications.   Assessment: 24 y.o. year old female now s/p uncomplicated Nexplanon removal.  Plan: 1) Contraception Having another Nexplanon placed today.     GYNECOLOGY PROCEDURE NOTE  Patient is a 25 y.o. G2P1101 presenting for Nexplanon re-insertion as her desires means of contraception.  She provided informed consent, signed copy in the chart, time out was performed. Pregnancy test was negative, with self reported LMP of No LMP recorded (exact date). Patient has had an implant.  She understands that Nexplanon is a progesterone only therapy, and that patients often patients have  irregular and unpredictable vaginal bleeding or amenorrhea. She understands that other side effects are possible related to systemic progesterone, including but not limited to, headaches, breast tenderness, nausea, and irritability. While effective at preventing pregnancy long acting reversible contraceptives do not prevent transmission of sexually transmitted diseases and use of barrier methods for this purpose was discussed. The placement procedure for Nexplanon was reviewed with the patient in detail including risks of nerve injury, infection, bleeding and injury to other muscles or tendons. She understands that the Nexplanon implant is good for 3 years and needs to be removed at the end of that time.  She understands that Nexplanon is an extremely effective option for contraception, with failure rate of <1%. This information is reviewed today and all questions were answered. Informed consent was obtained, both verbally and written.   The patient is healthy and has no contraindications to Implanon use. Urine pregnancy test was performed today and was negative.  Procedure Appropriate time out taken.  Patient placed in dorsal supine with her left arm above head, elbow flexed at 90 degrees, arm resting on examination table.  The bicipital grove was palpated and site 8-10cm proximal to the medial epicondyle was indentified . The insertion site was prepped with a two betadine swabs and then injected with 1.5cc of 1% lidocaine without epinephrine.  Nexplanon removed form sterile blister packaging,  Device confirmed in needle, before inserting full length of needle, tenting up the skin as the needle was advance.  The drug eluting rod was then deployed by pulling back the slider per the manufactures recommendation.  The implant  was palpable by the clinician as well as the patient.  The insertion site covered dressed with a band aid before applying  a kerlex bandage pressure dressing..Minimal blood loss was noted  during the procedure.  The patientt tolerated the procedure well.   She was instructed to wear the bandage for 24 hours, call with any signs of infection.  She was given the Implanon card and instructed to have the rod removed in 3 years.  Charge [phone] for nexplanon device, CPT [id number] for procedure J2001 for lidocaine administration Modifer 25, plus Modifer 79 is done during a global billing visit    Mirna Mires, CNM  08/19/2021 5:22 PM

## 2021-09-15 ENCOUNTER — Other Ambulatory Visit: Payer: Self-pay

## 2021-09-15 ENCOUNTER — Encounter: Payer: Self-pay | Admitting: Obstetrics

## 2021-09-15 ENCOUNTER — Other Ambulatory Visit (HOSPITAL_COMMUNITY)
Admission: RE | Admit: 2021-09-15 | Discharge: 2021-09-15 | Disposition: A | Discharge status: Home / Self Care | Payer: 59 | Source: Ambulatory Visit | Attending: Obstetrics | Admitting: Obstetrics

## 2021-09-15 ENCOUNTER — Ambulatory Visit (INDEPENDENT_AMBULATORY_CARE_PROVIDER_SITE_OTHER): Payer: 59 | Admitting: Obstetrics

## 2021-09-15 VITALS — BP 126/84 | Ht 64.0 in | Wt 184.0 lb

## 2021-09-15 DIAGNOSIS — Z113 Encounter for screening for infections with a predominantly sexual mode of transmission: Secondary | ICD-10-CM | POA: Diagnosis not present

## 2021-09-15 DIAGNOSIS — Z124 Encounter for screening for malignant neoplasm of cervix: Secondary | ICD-10-CM

## 2021-09-15 DIAGNOSIS — Z01419 Encounter for gynecological examination (general) (routine) without abnormal findings: Secondary | ICD-10-CM | POA: Diagnosis not present

## 2021-09-15 NOTE — Progress Notes (Signed)
Gynecology Annual Exam  PCP: Alba Cory, MD  Chief Complaint:  Chief Complaint  Patient presents with   Annual Exam    History of Present Illness:  [name] is a 26 y.o. G2P1101 who LMP was Patient's last menstrual period was 09/02/2021 (exact date)., presents today for her annual examination.  Her menses are regular every 28-30 days, lasting 6 day(s).  Dysmenorrhea none. She does not have intermenstrual bleeding.  She is single partner, contraception - Nexplanon.  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 There is no FH of ovarian cancer. The patient does not do self-breast exams.  Tobacco use: The patient denies current or previous tobacco use. Alcohol use: social drinker Exercise: moderately active    The patient wears seatbelts: yes.   The patient reports that domestic violence in her life is absent.   Past Medical History:  Diagnosis Date   Allergy    History of IUFD 06/2013   TURNER'S SYNDROME, HYPOPLASTIC HEART, CYSTIC HYGROMA   IUFD at 20 weeks or more of gestation 06/18/2013   23WKS    Past Surgical History:  Procedure Laterality Date   CESAREAN SECTION N/A 07/01/2015   Procedure: CESAREAN SECTION;  Surgeon: Nadara Mustard, MD;  Location: [facility] ORS;  Service: Obstetrics;  Laterality: N/A;   WISDOM TOOTH EXTRACTION      Prior to Admission medications   Medication Sig Start Date End Date Taking? Authorizing Provider  etonogestrel (NEXPLANON) 68 MG IMPL implant 1 each by Subdermal route once.   Yes [provider]  fluticasone (FLONASE) 50 MCG/ACT nasal spray Place 2 sprays into both nostrils as needed.    Yes [provider]    Allergies  Allergen Reactions   Other     Pollen - trees, grasses, flowers   Pineapple     Gynecologic History: Patient's last  menstrual period was 09/02/2021 (exact date). History of abnormal pap smear: Yes LGSIL with +HPV History of STI: No   Obstetric History: G2P1101  Social History   Socioeconomic History   Marital status: Single    Spouse name: Not on file   Number of children: 1   Years of education: 14   Highest education level: Not on file  Occupational History   Occupation: CNA  Tobacco Use   Smoking status: Never   Smokeless tobacco: Never  Vaping Use   Vaping Use: Never used  Substance and Sexual Activity   Alcohol use: No   Drug use: No   Sexual activity: Yes    Birth control/protection: Implant  Other Topics Concern   Not on file  Social History Narrative   Not on file   Social Determinants of Health   Financial Resource Strain: Not on file  Food Insecurity: Not on file  Transportation Needs: Not on file  Physical Activity: Not on file  Stress: Not on file  Social Connections: Not on file  Intimate Partner Violence: Not At Risk   Fear of Current or Ex-Partner: No   Emotionally Abused: No   Physically Abused: No   Sexually Abused: No    Family History  Problem Relation Age of Onset   Asthma Maternal Aunt    Hypertension Paternal Grandmother    Stroke Paternal Grandmother    Diabetes Other     Review of Systems  Constitutional: Negative.  HENT: Negative.    Eyes: Negative.   Respiratory: Negative.    Cardiovascular: Negative.   Gastrointestinal: Negative.   Genitourinary: Negative.   Musculoskeletal: Negative.   Skin: Negative.   Neurological: Negative.   Endo/Heme/Allergies: Negative.   Psychiatric/Behavioral: Negative.      Physical Exam BP 126/84    Ht 5\' 4"  (1.626 m)    Wt 184 lb (83.5 kg)    LMP 09/02/2021 (Exact Date)    BMI 31.58 kg/m    Physical Exam Constitutional:      Appearance: Normal appearance. She is obese.  Genitourinary:     Vulva and rectum normal.     Genitourinary Comments: Shaves entire escutcheon. No external lesions or  rashes. Normal vaginal rugae. Scant vaginal discharge. Uterus is anterior, non enlarged, mobile.  No adnexal masses  HENT:     Head: Normocephalic.  Cardiovascular:     Rate and Rhythm: Normal rate and regular rhythm.     Pulses: Normal pulses.     Heart sounds: Normal heart sounds.  Pulmonary:     Effort: Pulmonary effort is normal.     Breath sounds: Normal breath sounds.  Abdominal:     Palpations: Abdomen is soft.  Musculoskeletal:        General: Normal range of motion.     Cervical back: Normal range of motion and neck supple.  Neurological:     General: No focal deficit present.     Mental Status: She is alert and oriented to person, place, and time.  Skin:    General: Skin is dry.  Psychiatric:     Comments: PHQ is 4 GAD is 7  Vitals reviewed.    Female chaperone present for pelvic and breast  portions of the physical exam  Results: AUDIT NA PHQ-9: 4   Assessment: 26 y.o. G40P1101 female here for routine annual gynecologic examination Hx of LGSIL pap with HPV Requests STI screening.  Plan: Problem List Items Addressed This Visit   None Visit Diagnoses     Women's annual routine gynecological examination    -  Primary       Screening: -- Blood pressure screen normal -- Weight screening: overweight: continue to monitor -- Depression screening negative (PHQ-9) -- Nutrition: normal -- cholesterol screening: not due for screening -- osteoporosis screening: not due -- tobacco screening: not using -- alcohol screening: AUDIT questionnaire indicates low-risk usage. -- family history of breast cancer screening: done. not at high risk. -- no evidence of domestic violence or intimate partner violence. -- STD screening: gonorrhea/chlamydia NAAT collected -- pap smear collected per ASCCP guidelines -- flu vaccine  declines -- HPV vaccination series:  unknow hx Encouraged her to take a daily multivitmin RTC in one year for next annual. We will contact her  with the results of her pap smear.  G1P200, CNM  09/15/2021 4:47 PM   09/15/2021 4:02 PM

## 2021-09-17 LAB — CERVICOVAGINAL ANCILLARY ONLY
Bacterial Vaginitis (gardnerella): NEGATIVE
Chlamydia: NEGATIVE
Comment: NEGATIVE
Comment: NEGATIVE
Comment: NEGATIVE
Comment: NORMAL
Neisseria Gonorrhea: NEGATIVE
Trichomonas: NEGATIVE

## 2021-09-17 LAB — CYTOLOGY - PAP
Comment: NEGATIVE
Diagnosis: NEGATIVE
High risk HPV: NEGATIVE

## 2022-06-17 ENCOUNTER — Telehealth: Payer: Self-pay

## 2022-06-17 NOTE — Telephone Encounter (Signed)
Holly Holland called triage about spotting, Nexplanon was replaced in December. She was been spotting since last Monday and its a pinkish color when she wipes. In the morning time she was having to wear a panty liner.   Patient is asking for advice does she need to come in and be seen or is this normal.
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
## 2024-08-14 NOTE — Progress Notes (Unsigned)
      GYNECOLOGY OFFICE PROCEDURE NOTE  Holly Holland is a 28 y.o. G2P1101 here for Nexplanon  removal.  Last pap smear was on 09/15/2021 and was normal.  No other gynecologic concerns.  Nexplanon  Removal Patient identified, informed consent performed, consent signed.   Appropriate time out taken. Nexplanon  site identified.  Area prepped in usual sterile fashon. One ml of 1% lidocaine  was used to anesthetize the area at the distal end of the implant. A small stab incision was made right beside the implant on the distal portion.  Unable to remove nexplanon  due to location. Dr. Starla asked to attempt and was successful. See her note. The patient tolerated the procedure well and was given post procedure instructions.  Patient is planning to condoms for contraception/attempt conception.   Damien Parsley, CNM [facility]

## 2024-08-15 ENCOUNTER — Ambulatory Visit: Admitting: Certified Nurse Midwife

## 2024-08-15 ENCOUNTER — Encounter: Payer: Self-pay | Admitting: Certified Nurse Midwife

## 2024-08-15 VITALS — BP 128/81 | HR 71 | Resp 16 | Ht 64.0 in | Wt 199.3 lb

## 2024-08-15 DIAGNOSIS — Z3046 Encounter for surveillance of implantable subdermal contraceptive: Secondary | ICD-10-CM

## 2024-08-15 NOTE — Progress Notes (Signed)
 I was called to assist in the Nexplanon  removal.  Consent was signed and time out was done. Her left arm was prepped with betadine after establishing the position of the Nexplanon . The area was infiltrated with 2 cc of 1% lidocaine . A small incision was made and the intact rod was easily removed and noted to be intact.  A steristrip was placed and her arm was noted to be hemostatic. It was bandaged.  She tolerated the procedure well.
